# Patient Record
Sex: Female | Born: 1950
Health system: Southern US, Community
[De-identification: ages and names within clinical notes are randomized; demographics above are authoritative.]

## PROBLEM LIST (undated history)

## (undated) DIAGNOSIS — G250 Essential tremor: Principal | ICD-10-CM

## (undated) DIAGNOSIS — E785 Hyperlipidemia, unspecified: Secondary | ICD-10-CM

## (undated) DIAGNOSIS — I209 Angina pectoris, unspecified: Secondary | ICD-10-CM

## (undated) DIAGNOSIS — I2510 Atherosclerotic heart disease of native coronary artery without angina pectoris: Secondary | ICD-10-CM

## (undated) DIAGNOSIS — I10 Essential (primary) hypertension: Secondary | ICD-10-CM

## (undated) DIAGNOSIS — G4730 Sleep apnea, unspecified: Secondary | ICD-10-CM

## (undated) HISTORY — DX: Angina pectoris, unspecified: I20.9

## (undated) HISTORY — DX: Hyperlipidemia, unspecified: E78.5

## (undated) HISTORY — DX: Essential (primary) hypertension: I10

## (undated) HISTORY — DX: Atherosclerotic heart disease of native coronary artery without angina pectoris: I25.10

## (undated) HISTORY — DX: Sleep apnea, unspecified: G47.30

## (undated) HISTORY — DX: Essential tremor: G25.0

---

## 2001-11-13 ENCOUNTER — Observation Stay (HOSPITAL_COMMUNITY): Admission: AD | Admit: 2001-11-13 | Discharge: 2001-11-14 | Payer: Self-pay | Admitting: Obstetrics and Gynecology

## 2001-11-14 ENCOUNTER — Encounter: Payer: Self-pay | Admitting: Obstetrics and Gynecology

## 2001-11-21 ENCOUNTER — Ambulatory Visit (HOSPITAL_COMMUNITY): Admission: RE | Admit: 2001-11-21 | Discharge: 2001-11-21 | Payer: Self-pay | Admitting: Obstetrics and Gynecology

## 2001-11-21 ENCOUNTER — Encounter: Payer: Self-pay | Admitting: Obstetrics and Gynecology

## 2001-11-30 ENCOUNTER — Encounter (INDEPENDENT_AMBULATORY_CARE_PROVIDER_SITE_OTHER): Payer: Self-pay

## 2001-11-30 ENCOUNTER — Ambulatory Visit (HOSPITAL_COMMUNITY): Admission: RE | Admit: 2001-11-30 | Discharge: 2001-11-30 | Payer: Self-pay | Admitting: Obstetrics and Gynecology

## 2002-09-04 ENCOUNTER — Other Ambulatory Visit: Admission: RE | Admit: 2002-09-04 | Discharge: 2002-09-04 | Payer: Self-pay | Admitting: Obstetrics and Gynecology

## 2003-10-07 ENCOUNTER — Ambulatory Visit (HOSPITAL_COMMUNITY): Admission: RE | Admit: 2003-10-07 | Discharge: 2003-10-07 | Payer: Self-pay | Admitting: Obstetrics and Gynecology

## 2003-11-11 ENCOUNTER — Other Ambulatory Visit: Admission: RE | Admit: 2003-11-11 | Discharge: 2003-11-11 | Payer: Self-pay | Admitting: Obstetrics and Gynecology

## 2005-09-28 ENCOUNTER — Emergency Department (HOSPITAL_COMMUNITY): Admission: EM | Admit: 2005-09-28 | Discharge: 2005-09-28 | Payer: Self-pay | Admitting: Emergency Medicine

## 2007-02-27 ENCOUNTER — Ambulatory Visit (HOSPITAL_COMMUNITY): Admission: RE | Admit: 2007-02-27 | Discharge: 2007-02-27 | Payer: Self-pay | Admitting: Internal Medicine

## 2008-03-30 ENCOUNTER — Encounter: Admission: RE | Admit: 2008-03-30 | Discharge: 2008-03-30 | Payer: Self-pay | Admitting: Neurosurgery

## 2008-05-14 ENCOUNTER — Encounter: Admission: RE | Admit: 2008-05-14 | Discharge: 2008-05-14 | Payer: Self-pay | Admitting: Cardiology

## 2008-05-14 DIAGNOSIS — I209 Angina pectoris, unspecified: Secondary | ICD-10-CM

## 2008-05-14 HISTORY — DX: Angina pectoris, unspecified: I20.9

## 2008-05-15 ENCOUNTER — Ambulatory Visit (HOSPITAL_COMMUNITY): Admission: RE | Admit: 2008-05-15 | Discharge: 2008-05-16 | Payer: Self-pay | Admitting: Cardiology

## 2008-05-15 HISTORY — PX: CORONARY ANGIOPLASTY WITH STENT PLACEMENT: SHX49

## 2008-05-15 HISTORY — PX: CARDIAC CATHETERIZATION: SHX172

## 2009-01-30 ENCOUNTER — Emergency Department (HOSPITAL_COMMUNITY): Admission: EM | Admit: 2009-01-30 | Discharge: 2009-01-30 | Payer: Self-pay | Admitting: Emergency Medicine

## 2009-06-18 ENCOUNTER — Encounter: Admission: RE | Admit: 2009-06-18 | Discharge: 2009-06-18 | Payer: Self-pay | Admitting: Neurosurgery

## 2009-07-10 ENCOUNTER — Ambulatory Visit (HOSPITAL_COMMUNITY): Admission: RE | Admit: 2009-07-10 | Discharge: 2009-07-10 | Payer: Self-pay | Admitting: Neurosurgery

## 2009-12-19 ENCOUNTER — Encounter: Admission: RE | Admit: 2009-12-19 | Discharge: 2009-12-19 | Payer: Self-pay | Admitting: Neurosurgery

## 2010-01-01 ENCOUNTER — Ambulatory Visit (HOSPITAL_COMMUNITY): Admission: RE | Admit: 2010-01-01 | Discharge: 2010-01-01 | Payer: Self-pay | Admitting: Neurosurgery

## 2010-03-19 ENCOUNTER — Ambulatory Visit (HOSPITAL_COMMUNITY): Admission: RE | Admit: 2010-03-19 | Discharge: 2010-03-19 | Payer: Self-pay | Admitting: Neurosurgery

## 2010-06-23 DIAGNOSIS — I2510 Atherosclerotic heart disease of native coronary artery without angina pectoris: Secondary | ICD-10-CM

## 2010-06-23 HISTORY — DX: Atherosclerotic heart disease of native coronary artery without angina pectoris: I25.10

## 2010-09-01 ENCOUNTER — Ambulatory Visit (HOSPITAL_COMMUNITY): Admission: RE | Admit: 2010-09-01 | Discharge: 2010-09-01 | Payer: Self-pay | Admitting: Internal Medicine

## 2010-11-08 ENCOUNTER — Encounter: Payer: Self-pay | Admitting: Neurosurgery

## 2011-01-03 LAB — COMPREHENSIVE METABOLIC PANEL
AST: 29 U/L (ref 0–37)
Alkaline Phosphatase: 71 U/L (ref 39–117)
BUN: 19 mg/dL (ref 6–23)
CO2: 30 mEq/L (ref 19–32)
Calcium: 10 mg/dL (ref 8.4–10.5)
Chloride: 106 mEq/L (ref 96–112)
GFR calc Af Amer: >60 mL/min (ref >60)
GFR calc non Af Amer: >60 mL/min (ref >60)
Glucose, Bld: 88 mg/dL (ref 70–99)
Potassium: 4.6 mEq/L (ref 3.5–5.1)
Sodium: 141 mEq/L (ref 135–145)
Total Bilirubin: 0.8 mg/dL (ref 0.3–1.2)
Total Protein: 6.9 g/dL (ref 6.0–8.3)

## 2011-01-03 LAB — CBC: RDW: 12.3 % (ref 11.5–15.5)

## 2011-01-03 LAB — SURGICAL PCR SCREEN: MRSA, PCR: NEGATIVE

## 2011-01-09 LAB — BASIC METABOLIC PANEL
BUN: 14 mg/dL (ref 6–23)
CO2: 31 mEq/L (ref 19–32)
Calcium: 9.6 mg/dL (ref 8.4–10.5)
Creatinine, Ser: 0.81 mg/dL (ref 0.4–1.2)
GFR calc Af Amer: >60 mL/min (ref >60)
Glucose, Bld: 126 mg/dL — ABNORMAL HIGH (ref 70–99)
Potassium: 3.9 mEq/L (ref 3.5–5.1)
Sodium: 143 mEq/L (ref 135–145)

## 2011-01-09 LAB — SURGICAL PCR SCREEN: MRSA, PCR: NEGATIVE

## 2011-01-09 LAB — CBC
MCV: 94 fL (ref 78.0–100.0)
RBC: 4.57 MIL/uL (ref 3.87–5.11)

## 2011-01-21 LAB — BASIC METABOLIC PANEL
BUN: 13 mg/dL (ref 6–23)
Creatinine, Ser: 0.97 mg/dL (ref 0.4–1.2)
GFR calc Af Amer: >60 mL/min (ref >60)
Glucose, Bld: 88 mg/dL (ref 70–99)
Potassium: 4.4 mEq/L (ref 3.5–5.1)

## 2011-01-21 LAB — CBC
Hemoglobin: 14.4 g/dL (ref 12.0–15.0)
Platelets: 227 10*3/uL (ref 150–400)
RDW: 12.3 % (ref 11.5–15.5)
WBC: 6.7 10*3/uL (ref 4.0–10.5)

## 2011-01-21 LAB — PROTIME-INR: Prothrombin Time: 12.8 seconds (ref 11.6–15.2)

## 2011-03-01 NOTE — Cardiovascular Report (Signed)
NAME:  Carolyn Martin, Carolyn Martin              ACCOUNT NO.:  [id number]   MEDICAL RECORD NO.:  [id number]          PATIENT TYPE:  OIB   LOCATION:  2807                         FACILITY:  [facility]   PHYSICIAN:  Madaline Savage, M.D.DATE OF BIRTH:  Apr 07, 1951   DATE OF PROCEDURE:  05/15/3008  DATE OF DISCHARGE:                            CARDIAC CATHETERIZATION   PROCEDURES PERFORMED:  Coronary artery stenting of the proximal left  anterior descending coronary artery and also of the mid left anterior  descending coronary artery.   COMPLICATIONS:  None.   ENTRY SITE:  Right femoral.   DYE USED:  Omnipaque.   MEDICATIONS GIVEN:  1. 600 mg of Plavix by mouth.  2. Angiomax infusion.  3. Pepcid 40 mg intravenously.   COMPLICATIONS:  None.   This percutaneous intervention was performed subsequent to diagnostic  cardiac catheterization performed by Dr. Ritta Slot, my partner, who  performed diagnostic cardiac catheterization on [name].  I was then  asked to step in and dilate 2 areas of severe narrowing that were the  culprit lesions in the LAD that was responsible for her acute coronary  syndrome.   After the patient was confirmed to have an ACT of greater than 200  seconds, I ultimately used an XB left anterior descending coronary  artery catheter.  After several others failed, I used a BMW wire and I  basically direct stented the mid LAD with 2 overlapping MiniVision  stents, and the proximal left anterior descending coronary artery was  dilated with a third MiniVision stent that was 2.5 x 12.  The mid LAD  stents were 2.0 x 15 each and were overlapped.  I then postdilated both  areas with non-compliant balloons and lesions in both locations were  resolved.  Specifically, the proximal LAD stenosis of 90% was reduced to  0% residual with preservation of TIMI-3 flow and the mid-LAD long lesion  of approximately 25-26 mm was reduced from 90% down to 0% residual.  As  previously stated,  there was preservation of TIMI-3 flow distally.  No  complications occurred.  The patient tolerated the procedure well.   FINAL IMPRESSIONS:  Successful proximal left anterior descending  stenting with MiniVision stents 2.0 x 15, post dilated to approximately  2.3 mm and proximal left anterior descending stent of 2.5 x 12, post  dilated to approximately 2.75.           ______________________________  Madaline Savage, M.D.     WHG/MEDQ  D:  05/15/2008  T:  05/16/2008  Job:  [id number]   cc:   Ritta Slot, MD  [facility]

## 2011-03-01 NOTE — Discharge Summary (Signed)
NAME:  Carolyn Martin, Carolyn Martin NO.:  [id number]   MEDICAL RECORD NO.:  [id number]          PATIENT TYPE:  OIB   LOCATION:  2918                         FACILITY:  [facility]   PHYSICIAN:  Antonieta Iba, MD   DATE OF BIRTH:  09-20-1951   DATE OF ADMISSION:  05/15/2008  DATE OF DISCHARGE:  05/16/2008                               DISCHARGE SUMMARY   [name] is a 60 year old white married female patient of Dr.  Allyne Gee', who came to the office for a stress test.  She developed  abnormal EKG and the scan of her heart revealed abnormal images.  She  was then referred to see Dr. Elsie Lincoln in the office on the same day.  She  apparently has been having tightness around the base of her neck, which  she felt was related to her cervical spine disease.  She also noted that  on exertion that the symptoms would come.  She was recently also  diagnosed with hyperlipidemia, but she did not want to take the  medications.  She also has obstructive sleep apnea and has been  prescribed a CPAP, which she does not use regularly.  It was decided  that she should undergo cardiac catheterization, thus she came to the  hospital as an outpatient for cath.  She was initially cath'ed by Dr.  Lynnea Ferrier.  She had a 99% proximal LAD, 85% mid LAD, and a 30% RCA.  Her  EF of 65%.  She went on to have 3 stents placed in her LAD by Dr.  Elsie Lincoln.  It was a difficult case.  Please see his dictated report for  complete details.  She had a 2.0 x 15, a 2.5 x 12, and a 2.0 x 15 Multi-  Link Mini-Vision stent placed.  On the morning of May 16, 2008, she was  seen by Dr. Mariah Milling.  Her blood pressure was 95/50, her heart rate was  63, respirations 14, O2 sats were 96% on room air, and temperature was  98.2.   Her labs showed a sodium of 138, potassium 4.3, BUN 12, creatinine 0.88,  and glucose was 122.  CK was 125 and CK-MB 1.7.  Troponin was 0.04.  Hemoglobin 13.7, hematocrit 40.2, WBCs 11.1, and platelets 263.   She was  considered stable for discharge home.  She will be seen by Dr. Mariah Milling in  the office next Friday.  She was told not to return to work until May 26, 2008.  She was given a note about this.   DISCHARGE MEDICATIONS:  1. Plavix 75 mg a day, she should not stop it.  2. Aspirin 325 mg a day.  3. Simvastatin 40 mg at bedtime.  4. Vitamin D3, she takes 2 pills a week she was on previously.   DISCHARGE DIAGNOSES:  1. Angina and abnormal nuclear Myoview  2. Coronary artery disease, status post cath with high-grade disease      in her left anterior descending, minimal disease in her right      coronary artery with subsequent stenting with 3 Multi-Link Mini-  Vision stents, which are nondrug-eluting stents to her left      anterior descending.  3. Normal ejection fraction.  4. Hyperlipidemia, placed on 40 mg of simvastatin.  5. Obstructive sleep apnea, wears CPAP.      Lezlie Octave, N.P.      Antonieta Iba, MD  Electronically Signed    BB/MEDQ  D:  05/16/2008  T:  05/17/2008  Job:  [id number]   cc:   Candyce Churn. Allyne Gee, M.D.

## 2011-03-04 NOTE — Op Note (Signed)
[facility]  Patient:    Carolyn Martin, Carolyn Martin Visit Number: [id number] MRN: [id number]          Service Type: DSU Location: [facility] Attending Physician:  Jaymes Graff A Dictated by:   Pierre Bali Normand Sloop, M.D. Proc. Date: 11/30/01 Admit Date:  11/30/2001                             Operative Report  DATE OF BIRTH:                [birth year].  PREOPERATIVE DIAGNOSIS:       Menometrorrhagia.  POSTOPERATIVE DIAGNOSIS:      Menometrorrhagia.  OPERATION:                    Dilatation and curettage and                               hysteroscopy.  SURGEON:                      Naima A. Normand Sloop, M.D.  ANESTHESIA:                   MAC.  15 cc 1% lidocaine used for cervical block.  ESTIMATED BLOOD LOSS:         Minimal.  IV FLUIDS:                    800 cc crystalloid.  There was a minus 40 cc deficit of 3% sorbitol solution used for hysteroscopic fluid.  FINDINGS:                     Abundant fluffy endometrium.  No polyps or submucosal fibroids seen.  Both ostia were visualized.  The patient went to the recovery room in stable condition.  COMPLICATIONS:                None.  DESCRIPTION OF PROCEDURE:     The patient was taken to the operating room, given MAC anesthesia, placed in the dorsal lithotomy position and prepped and draped in the normal sterile fashion.  A bivalve speculum was placed into the vagina and the anterior lip of the cervix grasped with a single-tooth tenaculum.  The cervix was then dilated with Shawnie Pons dilators up to 21.  The hysteroscope was then placed into the uterus and the findings noted above were seen.  The hysteroscope was then removed and a sharp curettage was placed into the uterus and curettings were obtained.  There was abundant amount of curettings obtained.  All were sent to pathology.  All instruments were removed from the vagina.  Tenaculum was removed from the cervix with good hemostasis noted.  Sponge, lap and  needle counts were correct x 2.  The patient went to the recovery room in stable condition.  Before the procedure, the patient understands that this is done for therapeutic reasons.  She did have endometrial biopsy stating that she had secretory endometrium.  The patient also understands the risks of the procedure were, but not limited to, bleeding, infection, perforation of the uterus and damage to bowel, bladder and internal organs. Dictated by:   Pierre Bali. Normand Sloop, M.D. Attending Physician:  Michael Litter DD:  11/30/01 TD:  11/30/01 Job: [id number]

## 2011-03-04 NOTE — H&P (Signed)
[facility]  Patient:    Carolyn Martin, Carolyn Martin Visit Number: [id number] MRN: [id number]          Service Type: Attending:  Naima A. Normand Sloop, M.D. Dictated by:   Pierre Bali. Normand Sloop, M.D.                           History and Physical  HISTORY:                      Patient is a 60 year old white female G2, P2 who was seen in the ER last week and diagnosed with menometrorrhagia and fibroids. Patient was given Lupron in the hospital and iron but still reports that she is having some bleeding.  She does not have any shortness of breath, chest pain, or dizziness, but does report generalized weakness.  Patient was offered a blood transfusion while she was in the hospital, but declined and still declines.  PAST MEDICAL HISTORY:         Significant for degenerative joint disease.  PAST SURGICAL HISTORY:        Significant for appendectomy and ureteral transection and reanastomosis of the right side.  PAST GYNECOLOGIC HISTORY:     In the last couple of months her bleeding has become heavier where she changes a pad one every two hours.  States that she has irregular bleeding throughout the month.  ALLERGIES:                    No known drug allergies.  SOCIAL HISTORY:               She quit cigarette smoking in December 2002.                                Ultrasound showed endometrial stripe at 1.9 cm and a left lateral fundal fibroid 2.9 x 2.4 x 2.5 cm.  No adnexa was seen. Cannot exclude an endometrial polyp.  PHYSICAL EXAMINATION  VITAL SIGNS:                  Blood pressure 130/80, weight 165 pounds.  GENERAL:                      Patient is in no apparent distress.  HEART:                        Regular.  LUNGS:                        Clear.  ABDOMEN:                      Soft and nontender.  BREASTS:                      No masses, discharge, skin changes, or nipple retraction.  No axillary lymphadenopathy.  BACK:                         No CVA  tenderness.  ABDOMEN:                      Nontender.  No masses or organomegaly.  EXTREMITIES:  No clubbing, cyanosis, edema bilaterally.  PELVIC:                       Vulva/vaginal examination was within normal limits.  Cervix was nontender without any lesions.  Uterus is normal size, shape, and consistency with mild tenderness.  Adnexa had no masses.  LABORATORIES:                 Pregnancy test was found to be negative. Hemoglobin was stable at 6.9 from the hospital.  IMPRESSION:                   Patient with fibroids, menometrorrhagia, and anemia.  Endometrial biopsy was done and showed benign secretory endometrium without any hyperplasia or malignancy.  Patient received Lupron in the hospital and is currently taking Provera 10 mg q.d.  Patient still with some vaginal bleeding and anemia.  Although the hemoglobin is stable, because cannot rule out a polyp in endometrial canal, patient was scheduled for dilatation and curettage, hysteroscopy as soon as possible.  Patient was given bleeding precautions and told to call if she has vaginal bleeding that causes her to soak more than one pad an hour, has chest pain, shortness of breath, or dizziness.  Patient was also given iron prescription.  She was also offered a blood transfusion, but refused.  Risks and benefits were reviewed in detail. Patient does not want a hysterectomy and was given Lupron.  Does not want any other therapies at this given time except for the dilatation and curettage, hysteroscopy.  Patient understands that the risks are, but not limited to, bleeding, infection, perforation of the uterus, damage to internal organs such as bowel and bladder. Dictated by:   Pierre Bali. Normand Sloop, M.D. Attending:  Naima A. Dillard, M.D. DD:  11/29/01 TD:  11/29/01 Job: [id number]

## 2011-05-24 IMAGING — MG MM DIGITAL SCREENING
6 series · 6 of 6 positions shown · non-contrast
Comparison: none

DG SCREEN MAMMOGRAM BILATERAL
Bilateral CC and MLO view(s) were taken.

DIGITAL SCREENING MAMMOGRAM WITH CAD:
The breast tissue is heterogeneously dense.  No masses or malignant type calcifications are 
identified.  Compared with prior studies.
Images were processed with CAD.

[R CC]
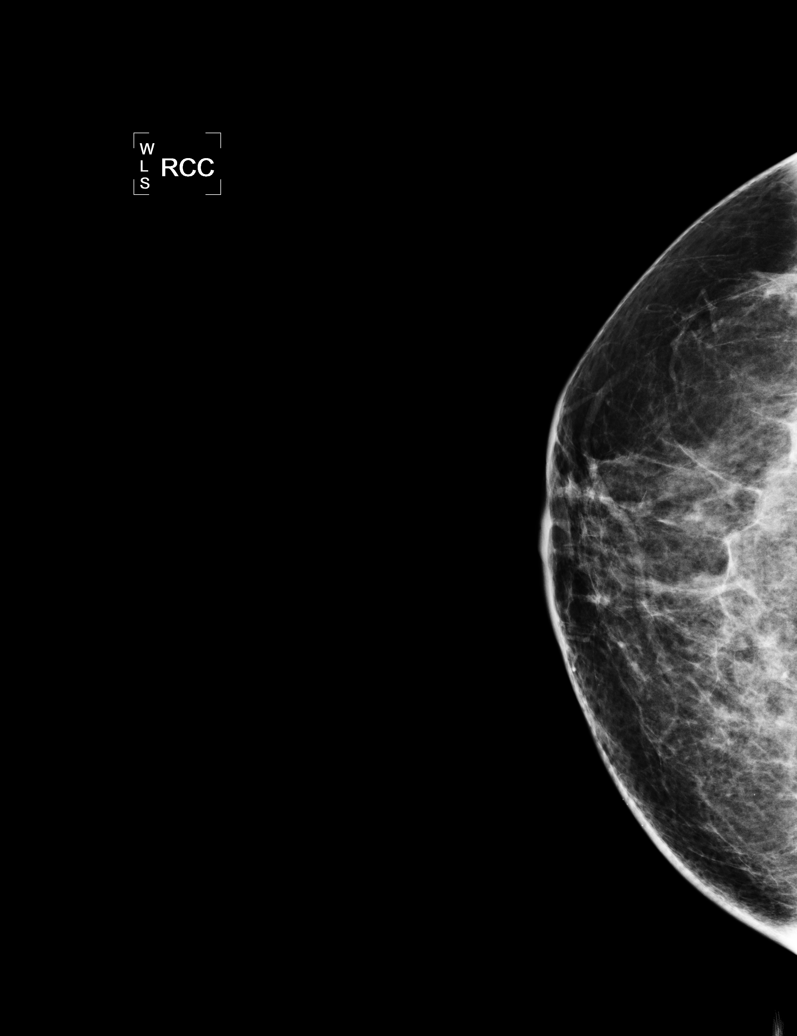

[R MLO (1 of 2)]
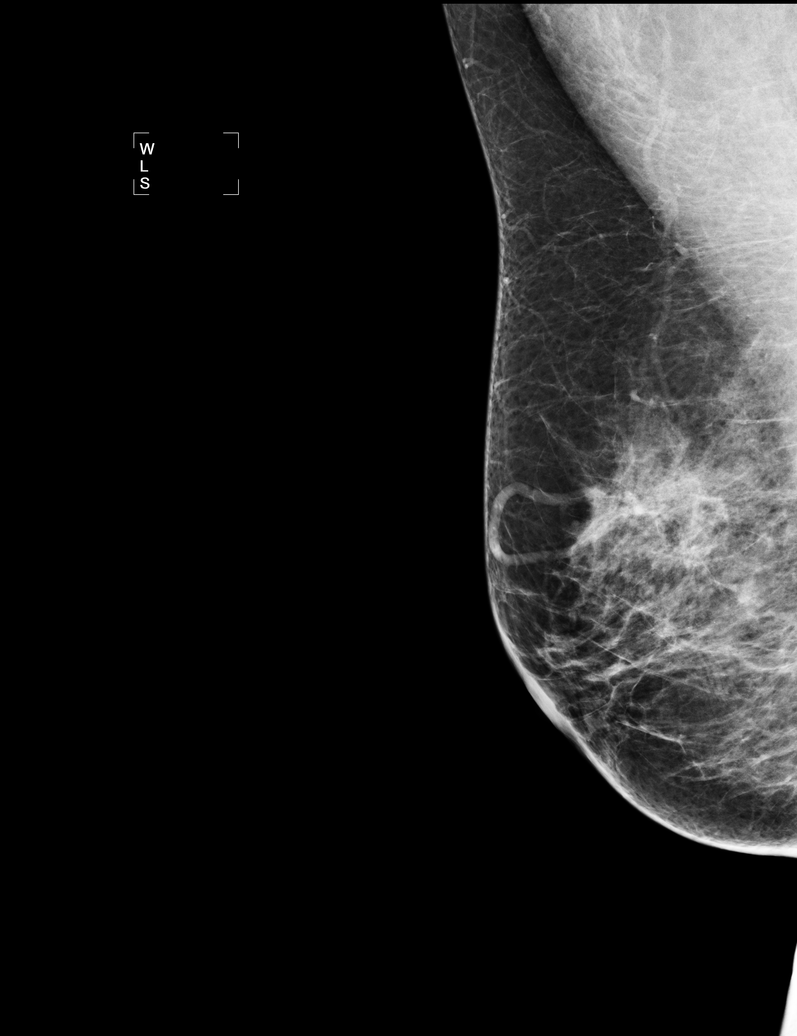

[L CC]
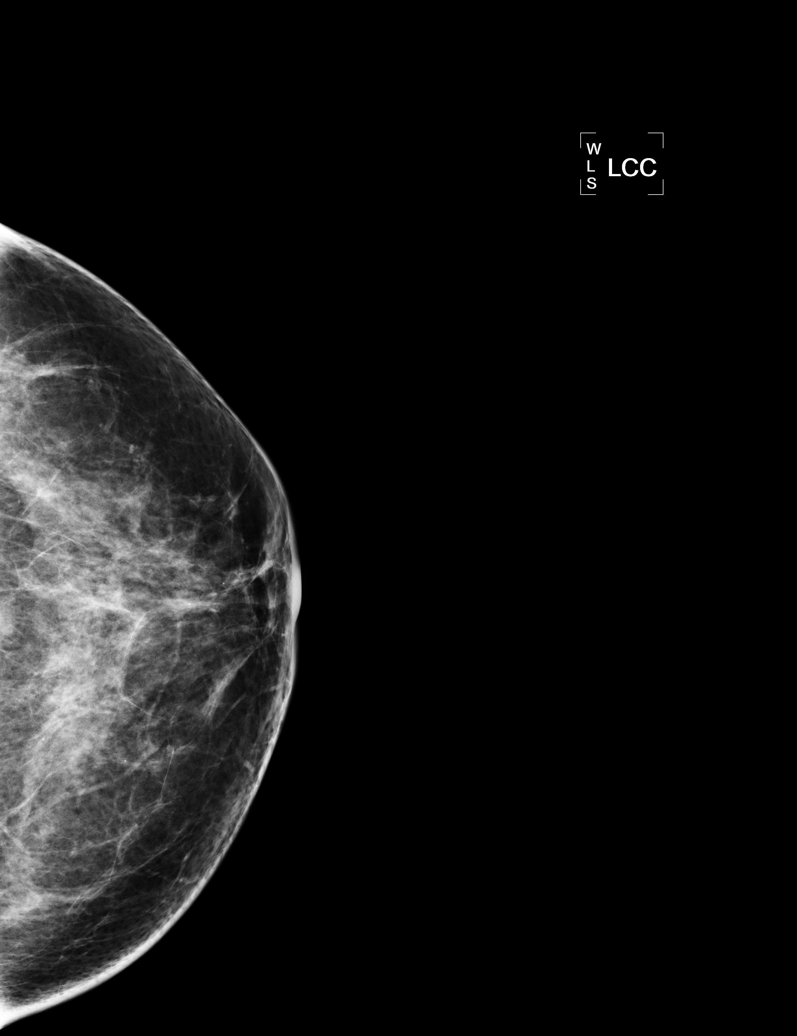

[L MLO (1 of 2)]
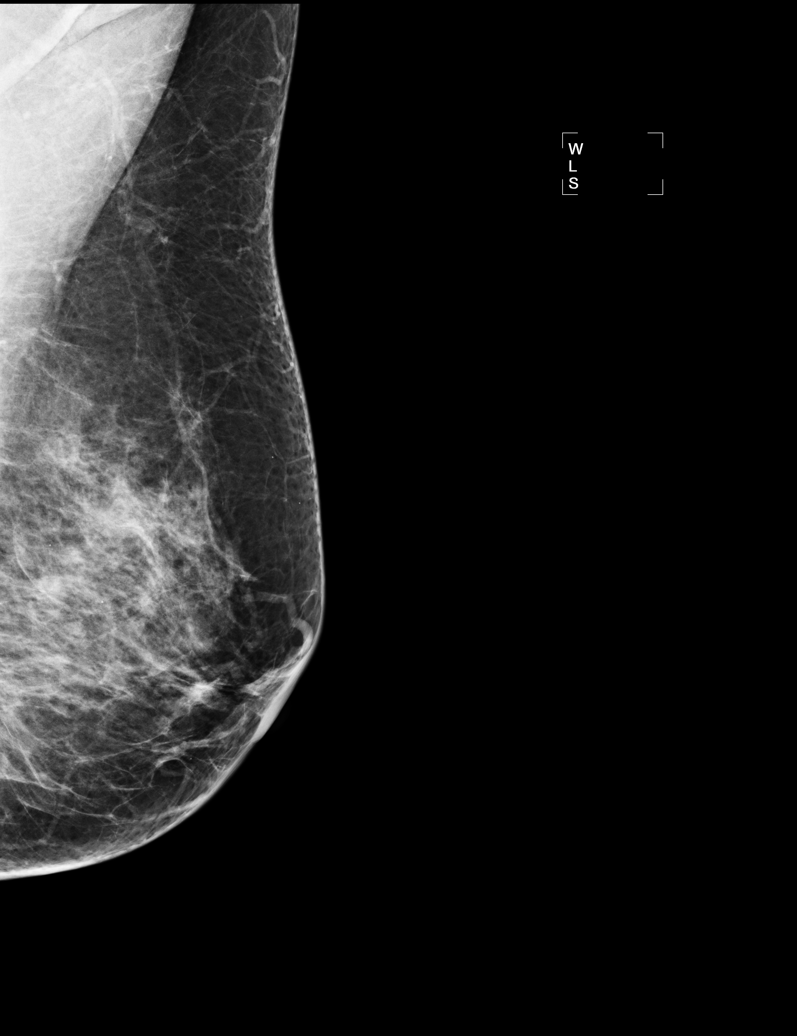

[L MLO (2 of 2)]
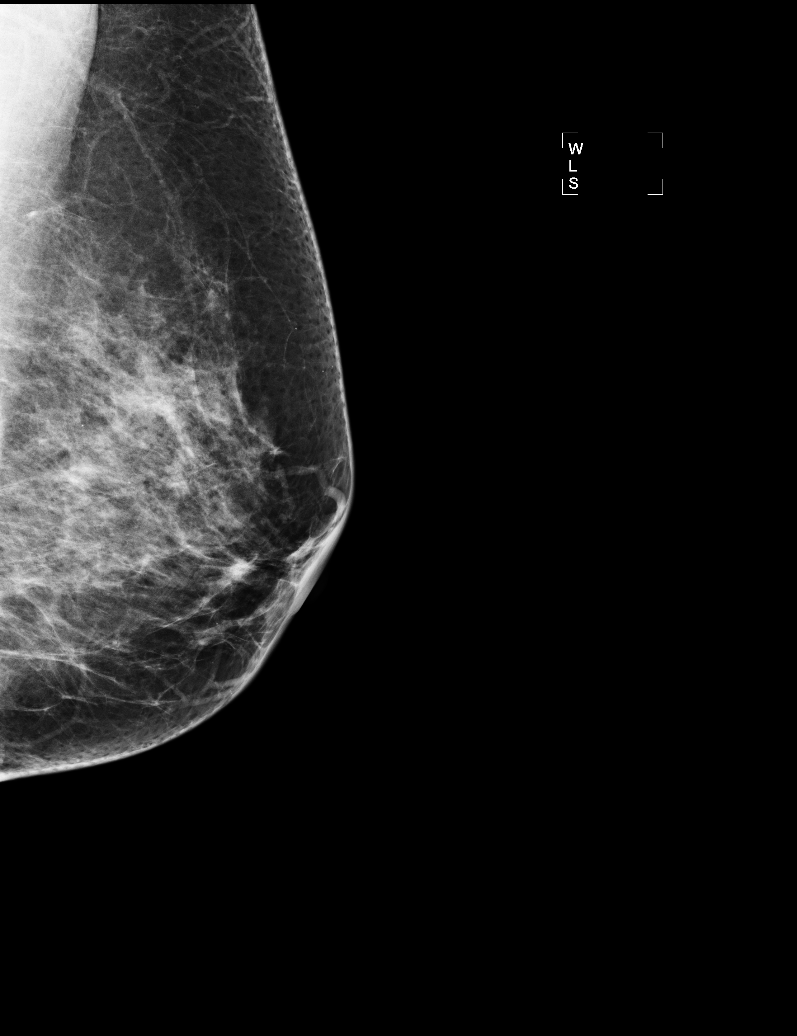

[R MLO (2 of 2)]
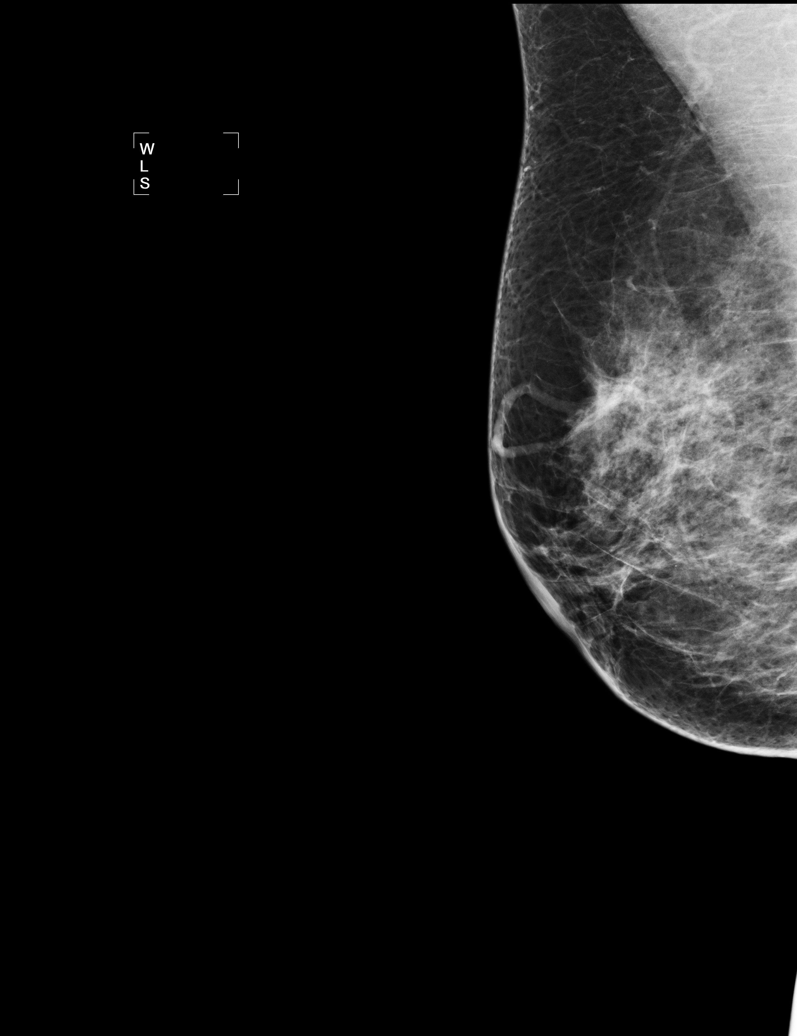

[6 of 6 positions shown; findings below may reference images not displayed]

IMPRESSION: No specific mammographic evidence of malignancy.  Next screening mammogram is recommended in one 
year.

A result letter of this screening mammogram will be mailed directly to the patient.

ASSESSMENT: Negative - BI-RADS 1

Screening mammogram in 1 year.
,

## 2011-07-15 LAB — CBC
HCT: 40.2
Hemoglobin: 13.7
MCHC: 34
MCV: 94.6
RBC: 4.24
WBC: 11.1 — ABNORMAL HIGH

## 2011-07-15 LAB — BASIC METABOLIC PANEL
Calcium: 9.1
Chloride: 106
Creatinine, Ser: 0.88

## 2011-07-29 ENCOUNTER — Other Ambulatory Visit (HOSPITAL_COMMUNITY): Payer: Self-pay | Admitting: Internal Medicine

## 2011-07-29 DIAGNOSIS — Z1231 Encounter for screening mammogram for malignant neoplasm of breast: Secondary | ICD-10-CM

## 2011-09-05 ENCOUNTER — Ambulatory Visit (HOSPITAL_COMMUNITY): Payer: Self-pay

## 2012-08-12 ENCOUNTER — Emergency Department: Payer: Self-pay | Admitting: Internal Medicine

## 2012-10-04 ENCOUNTER — Other Ambulatory Visit (HOSPITAL_COMMUNITY): Payer: Self-pay | Admitting: Internal Medicine

## 2012-10-04 DIAGNOSIS — Z1231 Encounter for screening mammogram for malignant neoplasm of breast: Secondary | ICD-10-CM

## 2012-10-23 ENCOUNTER — Ambulatory Visit (HOSPITAL_COMMUNITY): Payer: Federal, State, Local not specified - PPO

## 2012-11-01 ENCOUNTER — Ambulatory Visit (HOSPITAL_COMMUNITY)
Admission: RE | Admit: 2012-11-01 | Discharge: 2012-11-01 | Disposition: A | Discharge status: Home / Self Care | Payer: Federal, State, Local not specified - PPO | Source: Ambulatory Visit | Attending: Internal Medicine | Admitting: Internal Medicine

## 2012-11-01 DIAGNOSIS — Z1231 Encounter for screening mammogram for malignant neoplasm of breast: Secondary | ICD-10-CM | POA: Insufficient documentation

## 2012-12-16 ENCOUNTER — Encounter: Payer: Self-pay | Admitting: *Deleted

## 2013-01-15 ENCOUNTER — Encounter: Payer: Self-pay | Admitting: Cardiovascular Disease

## 2013-08-28 ENCOUNTER — Other Ambulatory Visit (HOSPITAL_COMMUNITY): Payer: Self-pay | Admitting: Internal Medicine

## 2013-08-28 DIAGNOSIS — Z1231 Encounter for screening mammogram for malignant neoplasm of breast: Secondary | ICD-10-CM

## 2013-12-24 ENCOUNTER — Other Ambulatory Visit (HOSPITAL_COMMUNITY): Payer: Self-pay | Admitting: Internal Medicine

## 2013-12-24 DIAGNOSIS — Z1231 Encounter for screening mammogram for malignant neoplasm of breast: Secondary | ICD-10-CM

## 2013-12-25 ENCOUNTER — Ambulatory Visit (HOSPITAL_COMMUNITY)
Admission: RE | Admit: 2013-12-25 | Discharge: 2013-12-25 | Disposition: A | Discharge status: Home / Self Care | Payer: Federal, State, Local not specified - PPO | Source: Ambulatory Visit | Attending: Internal Medicine | Admitting: Internal Medicine

## 2013-12-25 DIAGNOSIS — Z1231 Encounter for screening mammogram for malignant neoplasm of breast: Secondary | ICD-10-CM | POA: Insufficient documentation

## 2015-02-11 ENCOUNTER — Other Ambulatory Visit (HOSPITAL_COMMUNITY): Payer: Self-pay | Admitting: Internal Medicine

## 2015-02-11 DIAGNOSIS — Z1231 Encounter for screening mammogram for malignant neoplasm of breast: Secondary | ICD-10-CM

## 2015-02-13 ENCOUNTER — Ambulatory Visit (HOSPITAL_COMMUNITY)
Admission: RE | Admit: 2015-02-13 | Discharge: 2015-02-13 | Disposition: A | Discharge status: Home / Self Care | Payer: Federal, State, Local not specified - PPO | Source: Ambulatory Visit | Attending: Internal Medicine | Admitting: Internal Medicine

## 2015-02-13 DIAGNOSIS — Z1231 Encounter for screening mammogram for malignant neoplasm of breast: Secondary | ICD-10-CM | POA: Insufficient documentation

## 2016-01-13 ENCOUNTER — Other Ambulatory Visit: Payer: Self-pay

## 2016-01-13 DIAGNOSIS — Z1231 Encounter for screening mammogram for malignant neoplasm of breast: Secondary | ICD-10-CM

## 2016-01-20 ENCOUNTER — Telehealth: Payer: Self-pay | Admitting: Cardiovascular Disease

## 2016-01-20 NOTE — Telephone Encounter (Signed)
Received records from [facility] for appointment on 01/21/16 with Dr Gwenlyn Found.  Records given to [facility] (medical records) for Dr Kennon Holter schedule on 01/21/16. lp

## 2016-01-21 ENCOUNTER — Ambulatory Visit (INDEPENDENT_AMBULATORY_CARE_PROVIDER_SITE_OTHER): Payer: Federal, State, Local not specified - PPO | Admitting: Cardiovascular Disease

## 2016-01-21 ENCOUNTER — Encounter: Payer: Self-pay | Admitting: Cardiovascular Disease

## 2016-01-21 VITALS — BP 146/90 | HR 67 | Ht 63.0 in | Wt 145.0 lb

## 2016-01-21 DIAGNOSIS — I10 Essential (primary) hypertension: Secondary | ICD-10-CM

## 2016-01-21 DIAGNOSIS — I2583 Coronary atherosclerosis due to lipid rich plaque: Secondary | ICD-10-CM

## 2016-01-21 DIAGNOSIS — R079 Chest pain, unspecified: Secondary | ICD-10-CM

## 2016-01-21 DIAGNOSIS — I2510 Atherosclerotic heart disease of native coronary artery without angina pectoris: Secondary | ICD-10-CM

## 2016-01-21 DIAGNOSIS — E785 Hyperlipidemia, unspecified: Secondary | ICD-10-CM | POA: Diagnosis not present

## 2016-01-21 NOTE — Assessment & Plan Note (Addendum)
History of hyperlipidemia on Crestor with recent lipid profile performed by her PCP revealing a total cholesterol 156, LDL of 69 and HDL of 54.

## 2016-01-21 NOTE — Assessment & Plan Note (Signed)
History of CAD status post cardiac catheterization performed by Dr. Elisabeth Cara 05/15/08 revealing high-grade proximal and mid LAD disease with stenting performed with Dr. Melvern Banker using multiple overlapping mini vision bare metal stents. The patient does complain of occasional chest pain. I'm going to get an exercise Myoview stress test to risk stratify her.

## 2016-01-21 NOTE — Patient Instructions (Signed)
Medication Instructions:  Your physician recommends that you continue on your current medications as directed. Please refer to the Current Medication list given to you today.   Labwork: none  Testing/Procedures: Your physician has requested that you have en exercise stress myoview. For further information please visit [email or web address]. Please follow instruction sheet, as given.    Follow-Up: Your physician wants you to follow-up in: 12 months with Dr. Berry. You will receive a reminder letter in the mail two months in advance. If you don't receive a letter, please call our office to schedule the follow-up appointment.   Any Other Special Instructions Will Be Listed Below (If Applicable).     If you need a refill on your cardiac medications before your next appointment, please call your pharmacy.   

## 2016-01-21 NOTE — Assessment & Plan Note (Signed)
History of hypertension blood pressure measured at 146/90. This is higher than it usually runs. She is not on any antihypertensive medications.

## 2016-01-21 NOTE — Progress Notes (Signed)
01/21/2016 Carolyn Martin   12/21/1950  IY:[id number]  Primary Physician [name], MD Primary Cardiologist: Lorretta Harp MD Renae Gloss   HPI: Mrs. Kalicki is a 65 year old thin-appearing married Caucasian female mother of 2, grandmother 2 grandchildren who we saw in our practice remotely. She is referred back to be reestablished because of occasional chest pain. She is retired from working at the [employer] for 32 years. Risk factors include treated hypertension and hyperlipidemia. Her mother did have coronary artery bypass grafting in her 90s. She had LAD stenting, Dr. Melvern Banker using 3 sequential bare-metal stents 05/15/08. She does get occasional chest pain.   Current Outpatient Prescriptions  Medication Sig Dispense Refill  . Ascorbic Acid (VITAMIN C PO) Take 500 mg by mouth daily.     Marland Kitchen aspirin 81 MG tablet Take 81 mg by mouth daily.    . B Complex Vitamins (B COMPLEX 100 PO) Take 100 mg by mouth daily.    . Cholecalciferol (VITAMIN D-3 PO) Take 5,000 Units by mouth daily.     . citalopram (CELEXA) 10 MG tablet Take 10 mg by mouth daily.    . clonazePAM (KLONOPIN) 0.5 MG tablet Take 0.5 mg by mouth 2 (two) times daily as needed for anxiety.    . Coenzyme Q10 (CO Q 10 PO) Take 100 mg by mouth daily.     Marland Kitchen KRILL OIL PO Take 1 tablet by mouth daily.    . rosuvastatin (CRESTOR) 10 MG tablet Take 10 mg by mouth daily.     No current facility-administered medications for this visit.    Allergies  Allergen Reactions  . Sulfa Antibiotics     Social History   Social History  . Marital Status: Married    Spouse Name: Hendricks Milo  . Number of Children: 2  . Years of Education: N/A   Occupational History  .  [employer]   Social History Main Topics  . Smoking status: Former Smoker    Types: Cigarettes    Quit date: 12/17/2006  . Smokeless tobacco: Never Used  . Alcohol Use: Not on file  . Drug Use: Not on file  . Sexual Activity: Not on file   Other  Topics Concern  . Not on file   Social History Narrative     Review of Systems: General: negative for chills, fever, night sweats or weight changes.  Cardiovascular: negative for chest pain, dyspnea on exertion, edema, orthopnea, palpitations, paroxysmal nocturnal dyspnea or shortness of breath Dermatological: negative for rash Respiratory: negative for cough or wheezing Urologic: negative for hematuria Abdominal: negative for nausea, vomiting, diarrhea, bright red blood per rectum, melena, or hematemesis Neurologic: negative for visual changes, syncope, or dizziness All other systems reviewed and are otherwise negative except as noted above.    Blood pressure 146/90, pulse 67, height 5\' 3"  (1.6 m), weight 145 lb (65.772 kg).  General appearance: alert and no distress Neck: no adenopathy, no carotid bruit, no JVD, supple, symmetrical, trachea midline and thyroid not enlarged, symmetric, no tenderness/mass/nodules Lungs: clear to auscultation bilaterally Heart: regular rate and rhythm, S1, S2 normal, no murmur, click, rub or gallop Extremities: extremities normal, atraumatic, no cyanosis or edema  EKG normal sinus rhythm at 67 without ST or T-wave changes. I personally reviewed this EKG  ASSESSMENT AND PLAN:   Essential hypertension History of hypertension blood pressure measured at 146/90. This is higher than it usually runs. She is not on any antihypertensive medications.  Hyperlipidemia History of  hyperlipidemia on Crestor with recent lipid profile performed by her PCP revealing a total cholesterol 156, LDL of 69 and HDL of 54.  Coronary artery disease due to lipid rich plaque History of CAD status post cardiac catheterization performed by Dr. Elisabeth Cara 05/15/08 revealing high-grade proximal and mid LAD disease with stenting performed with Dr. Melvern Banker using multiple overlapping mini vision bare metal stents. The patient does complain of occasional chest pain. I'm going to get an  exercise Myoview stress test to risk stratify her.      Lorretta Harp MD FACP,FACC,FAHA, [facility] 01/21/2016 4:40 PM

## 2016-01-27 ENCOUNTER — Telehealth: Payer: Self-pay | Admitting: Cardiovascular Disease

## 2016-01-27 NOTE — Telephone Encounter (Signed)
No need for prophylaxis.  Leonie Man, MD

## 2016-01-27 NOTE — Telephone Encounter (Signed)
Pt have stents,needs to know if pt needs to be pre- medicated before dental work?

## 2016-01-27 NOTE — Telephone Encounter (Signed)
Informed Kristi per guidelines, does not look like pt would need prophylaxis for dental procedures. Will route to Dr. Ellyn Hack ([facility]) to verify.  If OK, will fax a note to dentist office at [phone]

## 2016-01-27 NOTE — Telephone Encounter (Signed)
Faxed msg.

## 2016-01-29 ENCOUNTER — Ambulatory Visit: Payer: Federal, State, Local not specified - PPO | Admitting: Neurology

## 2016-02-03 ENCOUNTER — Telehealth (HOSPITAL_COMMUNITY): Payer: Self-pay

## 2016-02-03 NOTE — Telephone Encounter (Signed)
Encounter complete. 

## 2016-02-05 ENCOUNTER — Ambulatory Visit (HOSPITAL_COMMUNITY)
Admission: RE | Admit: 2016-02-05 | Discharge: 2016-02-05 | Disposition: A | Discharge status: Home / Self Care | Payer: Federal, State, Local not specified - PPO | Source: Ambulatory Visit | Attending: Cardiovascular Disease | Admitting: Cardiovascular Disease

## 2016-02-05 DIAGNOSIS — I2510 Atherosclerotic heart disease of native coronary artery without angina pectoris: Secondary | ICD-10-CM | POA: Diagnosis not present

## 2016-02-05 DIAGNOSIS — Z87891 Personal history of nicotine dependence: Secondary | ICD-10-CM | POA: Diagnosis not present

## 2016-02-05 DIAGNOSIS — R002 Palpitations: Secondary | ICD-10-CM | POA: Diagnosis not present

## 2016-02-05 DIAGNOSIS — R079 Chest pain, unspecified: Secondary | ICD-10-CM | POA: Diagnosis not present

## 2016-02-05 DIAGNOSIS — I2583 Coronary atherosclerosis due to lipid rich plaque: Secondary | ICD-10-CM

## 2016-02-05 DIAGNOSIS — Z8249 Family history of ischemic heart disease and other diseases of the circulatory system: Secondary | ICD-10-CM | POA: Diagnosis not present

## 2016-02-05 DIAGNOSIS — I10 Essential (primary) hypertension: Secondary | ICD-10-CM | POA: Insufficient documentation

## 2016-02-05 DIAGNOSIS — E785 Hyperlipidemia, unspecified: Secondary | ICD-10-CM | POA: Diagnosis not present

## 2016-02-05 LAB — MYOCARDIAL PERFUSION IMAGING
CHL CUP RESTING HR STRESS: 76 {beats}/min
CHL RATE OF PERCEIVED EXERTION: 15
CSEPHR: 88 %
Estimated workload: 10.1 METS
Exercise duration (min): 9 min
MPHR: 157 {beats}/min
Peak HR: 139 {beats}/min

## 2016-02-05 MED ORDER — TECHNETIUM TC 99M SESTAMIBI GENERIC - CARDIOLITE
30.1000 | Freq: Once | INTRAVENOUS | Status: AC | PRN
  Administered 2016-02-05: 30.1 via INTRAVENOUS

## 2016-02-05 MED ORDER — TECHNETIUM TC 99M SESTAMIBI GENERIC - CARDIOLITE
10.1000 | Freq: Once | INTRAVENOUS | Status: AC | PRN
  Administered 2016-02-05: 10.1 via INTRAVENOUS

## 2016-02-19 ENCOUNTER — Ambulatory Visit
Admission: RE | Admit: 2016-02-19 | Discharge: 2016-02-19 | Disposition: A | Discharge status: Home / Self Care | Payer: Federal, State, Local not specified - PPO | Source: Ambulatory Visit

## 2016-02-19 ENCOUNTER — Encounter: Payer: Self-pay | Admitting: Neurology

## 2016-02-19 ENCOUNTER — Ambulatory Visit (INDEPENDENT_AMBULATORY_CARE_PROVIDER_SITE_OTHER): Payer: Federal, State, Local not specified - PPO | Admitting: Neurology

## 2016-02-19 VITALS — BP 140/82 | HR 72 | Ht 63.0 in | Wt 143.5 lb

## 2016-02-19 DIAGNOSIS — G250 Essential tremor: Secondary | ICD-10-CM | POA: Diagnosis not present

## 2016-02-19 DIAGNOSIS — Z1231 Encounter for screening mammogram for malignant neoplasm of breast: Secondary | ICD-10-CM

## 2016-02-19 HISTORY — DX: Essential tremor: G25.0

## 2016-02-19 NOTE — Patient Instructions (Signed)
Tremor °A tremor is trembling or shaking that you cannot control. Most tremors affect the hands or arms. Tremors can also affect the head, vocal cords, face, and other parts of the body. There are many types of tremors. Common types include:  °· Essential tremor. These usually occur in people over the age of 40. It may run in families and can happen in otherwise healthy people.   °· Resting tremor. These occur when the muscles are at rest, such as when your hands are resting in your lap. People with Parkinson disease often have resting tremors.   °· Postural tremor. These occur when you try to hold a pose, such as keeping your hands outstretched.   °· Kinetic tremor. These occur during purposeful movement, such as trying to touch a finger to your nose.   °· Task-specific tremor. These may occur when you perform tasks such as handwriting, speaking, or standing.   °· Psychogenic tremor. These dramatically lessen or disappear when you are distracted. They can happen in people of all ages.   °Some types of tremors have no known cause. Tremors can also be a symptom of nervous system problems (neurological disorders) that may occur with aging. Some tremors go away with treatment while others do not.  °HOME CARE INSTRUCTIONS °Watch your tremor for any changes. The following actions may help to lessen any discomfort you are feeling: °· Take medicines only as directed by your health care provider.   °· Limit alcohol intake to no more than 1 drink per day for nonpregnant women and 2 drinks per day for men. One drink equals 12 oz of beer, 5 oz of wine, or 1½ oz of hard liquor. °· Do not use any tobacco products, including cigarettes, chewing tobacco, or electronic cigarettes. If you need help quitting, ask your health care provider.   °· Avoid extreme heat or cold.    °· Limit the amount of caffeine you consume as directed by your health care provider.   °· Try to get 8 hours of sleep each night. °· Find ways to manage your  stress, such as meditation or yoga. °· Keep all follow-up visits as directed by your health care provider. This is important. °SEEK MEDICAL CARE IF: °· You start having a tremor after starting a new medicine. °· You have tremor with other symptoms such as: °¨ Numbness. °¨ Tingling. °¨ Pain. °¨ Weakness. °· Your tremor gets worse. °· Your tremor interferes with your day-to-day life. °  °This information is not intended to replace advice given to you by your health care provider. Make sure you discuss any questions you have with your health care provider. °  °Document Released: 09/23/2002 Document Revised: 10/24/2014 Document Reviewed: 03/31/2014 °Elsevier Interactive Patient Education ©2016 [employer] ° °

## 2016-02-19 NOTE — Progress Notes (Signed)
Reason for visit: Tremor  Referring physician: Dr. Lillie Fragmin Carolyn Martin is a 65 y.o. female  History of present illness:  Carolyn Martin is a 65 year old right-handed white female with a history of a tremor that has affected the head and neck over the last 7-8 years. As time has gone on, patient has noted that the tremor has become a bit more prominent. The tremor is a side-to-side head tremor unassociated with involvement of the arms, jaw, or voice. The patient indicates that there is no family history of tremor. She indicates that the tremor of the head is present with primary gaze, not with turning the head from one side to the next. She denies any weakness or numbness of the face, arms, or legs. She denies any balance issues or difficulty controlling the bowels or the bladder. The patient does have some neck stiffness and some shoulder discomfort. She reports that she is under a lot of stress. She is sent to this office for an evaluation. Recent blood work has included a normal TSH.  Past Medical History  Diagnosis Date  . Hypertension   . Hyperlipidemia   . Coronary artery disease 06/23/2010    Echo EF =>55%,LV normal  . Anginal pain ([place]) 05/14/2008    atypical chest pain-- Exercise  Myoview  EF 61% ,mod to severe ischemia Basal Anterior ,Anteroseptal,Mid Anterior,Mid Anteroseptal, Apical Anterior and Apical Septal regions   LV normal   . Sleep apnea   . Tremor, essential 02/19/2016    Past Surgical History  Procedure Laterality Date  . Cardiac catheterization  05/15/2008    2 areas of LAD  . Coronary angioplasty with stent placement  05/15/2008     proximal LAD -MiniVision stent 2.5 x 12 and mid LAD 2 overlapping MiniVision 2.0 x 15    Family History  Problem Relation Age of Onset  . Diabetes Mother   . Hypertension Mother   . Transient ischemic attack Father   . Coronary artery disease Father   . Dementia Father     Social history:  reports that she quit smoking  about 9 years ago. Her smoking use included Cigarettes. She has never used smokeless tobacco. She reports that she does not drink alcohol or use illicit drugs.  Medications:  Prior to Admission medications   Medication Sig Start Date End Date Taking? Authorizing Provider  Ascorbic Acid (VITAMIN C PO) Take 500 mg by mouth daily.    Yes Historical Provider, MD  aspirin 81 MG tablet Take 81 mg by mouth daily.   Yes Historical Provider, MD  B Complex Vitamins (B COMPLEX 100 PO) Take 100 mg by mouth daily.   Yes Historical Provider, MD  Cholecalciferol (VITAMIN D-3 PO) Take 5,000 Units by mouth daily.    Yes Historical Provider, MD  citalopram (CELEXA) 10 MG tablet Take 10 mg by mouth daily.   Yes Historical Provider, MD  clonazePAM (KLONOPIN) 0.5 MG tablet Take 0.5 mg by mouth 2 (two) times daily as needed for anxiety.   Yes Historical Provider, MD  Coenzyme Q10 (CO Q 10 PO) Take 100 mg by mouth daily.    Yes Historical Provider, MD  KRILL OIL PO Take 1 tablet by mouth daily.   Yes Historical Provider, MD  rosuvastatin (CRESTOR) 10 MG tablet Take 10 mg by mouth daily.   Yes Historical Provider, MD      Allergies  Allergen Reactions  . Sulfa Antibiotics     ROS:  Out of  a complete 14 system review of symptoms, the patient complains only of the following symptoms, and all other reviewed systems are negative.  Ringing in ears Snoring Blood in the urine Feeling hot Joint pain Confusion, numbness, tremor Depression, anxiety, racing thoughts Insomnia  Blood pressure 140/82, pulse 72, height 5\' 3"  (1.6 m), weight 143 lb 8 oz (65.091 kg).  Physical Exam  General: The patient is alert and cooperative at the time of the examination.  Eyes: Pupils are equal, round, and reactive to light. Discs are flat bilaterally.  Neck: The neck is supple, no carotid bruits are noted.  Respiratory: The respiratory examination is clear.  Cardiovascular: The cardiovascular examination reveals a  regular rate and rhythm, no obvious murmurs or rubs are noted.  Skin: Extremities are without significant edema.  Neurologic Exam  Mental status: The patient is alert and oriented x 3 at the time of the examination. The patient has apparent normal recent and remote memory, with an apparently normal attention span and concentration ability.  Cranial nerves: Facial symmetry is present. There is good sensation of the face to pinprick and soft touch bilaterally. The strength of the facial muscles and the muscles to head turning and shoulder shrug are normal bilaterally. Speech is well enunciated, no aphasia or dysarthria is noted. Extraocular movements are full. Visual fields are full. The tongue is midline, and the patient has symmetric elevation of the soft palate. No obvious hearing deficits are noted. There is a side-to-side head tremor noted.  Motor: The motor testing reveals 5 over 5 strength of all 4 extremities. Good symmetric motor tone is noted throughout.  Sensory: Sensory testing is intact to pinprick, soft touch, vibration sensation, and position sense on all 4 extremities. No evidence of extinction is noted.  Coordination: Cerebellar testing reveals good finger-nose-finger and heel-to-shin bilaterally.  Gait and station: Gait is normal. Tandem gait is normal. Romberg is negative. No drift is seen.  Reflexes: Deep tendon reflexes are symmetric and normal bilaterally. Toes are downgoing bilaterally.   Assessment/Plan:  1. Probable essential tremor  The patient has a side-to-side head tremor. She indicates that this may keep her awake at night at times, she will try clonazepam in the evening hours to see if this reduces the tremor and allows her to sleep better. The patient has been under a lot of stress recently. The patient will contact our office if she is not doing well with controlling the tremor. No further workup will be initiated at this time.  Jill Alexanders MD 02/21/2016  2:17 PM  [facility] [address] [place], [place] [address]  Phone [phone] Fax [phone]

## 2017-04-03 ENCOUNTER — Other Ambulatory Visit: Payer: Self-pay | Admitting: Internal Medicine

## 2017-04-03 DIAGNOSIS — Z1231 Encounter for screening mammogram for malignant neoplasm of breast: Secondary | ICD-10-CM

## 2017-04-14 ENCOUNTER — Ambulatory Visit
Admission: RE | Admit: 2017-04-14 | Discharge: 2017-04-14 | Disposition: A | Discharge status: Home / Self Care | Payer: Federal, State, Local not specified - PPO | Source: Ambulatory Visit | Attending: Internal Medicine | Admitting: Internal Medicine

## 2017-04-14 DIAGNOSIS — Z1231 Encounter for screening mammogram for malignant neoplasm of breast: Secondary | ICD-10-CM | POA: Diagnosis not present

## 2017-04-14 DIAGNOSIS — W57XXXS Bitten or stung by nonvenomous insect and other nonvenomous arthropods, sequela: Secondary | ICD-10-CM | POA: Diagnosis not present

## 2017-04-14 DIAGNOSIS — R21 Rash and other nonspecific skin eruption: Secondary | ICD-10-CM | POA: Diagnosis not present

## 2017-05-23 DIAGNOSIS — R251 Tremor, unspecified: Secondary | ICD-10-CM | POA: Diagnosis not present

## 2017-05-23 DIAGNOSIS — E785 Hyperlipidemia, unspecified: Secondary | ICD-10-CM | POA: Diagnosis not present

## 2017-05-23 DIAGNOSIS — F329 Major depressive disorder, single episode, unspecified: Secondary | ICD-10-CM | POA: Diagnosis not present

## 2017-05-23 DIAGNOSIS — E2839 Other primary ovarian failure: Secondary | ICD-10-CM | POA: Diagnosis not present

## 2017-05-23 DIAGNOSIS — I2510 Atherosclerotic heart disease of native coronary artery without angina pectoris: Secondary | ICD-10-CM | POA: Diagnosis not present

## 2017-05-23 DIAGNOSIS — Z0000 Encounter for general adult medical examination without abnormal findings: Secondary | ICD-10-CM | POA: Diagnosis not present

## 2017-05-23 DIAGNOSIS — M542 Cervicalgia: Secondary | ICD-10-CM | POA: Diagnosis not present

## 2017-05-23 DIAGNOSIS — E559 Vitamin D deficiency, unspecified: Secondary | ICD-10-CM | POA: Diagnosis not present

## 2017-05-25 ENCOUNTER — Other Ambulatory Visit: Payer: Self-pay | Admitting: Internal Medicine

## 2017-05-25 DIAGNOSIS — E2839 Other primary ovarian failure: Secondary | ICD-10-CM

## 2017-06-02 ENCOUNTER — Ambulatory Visit
Admission: RE | Admit: 2017-06-02 | Discharge: 2017-06-02 | Disposition: A | Discharge status: Home / Self Care | Payer: Medicare Other | Source: Ambulatory Visit | Attending: Internal Medicine | Admitting: Internal Medicine

## 2017-06-02 DIAGNOSIS — Z13820 Encounter for screening for osteoporosis: Secondary | ICD-10-CM | POA: Diagnosis not present

## 2017-06-02 DIAGNOSIS — E2839 Other primary ovarian failure: Secondary | ICD-10-CM

## 2017-06-02 DIAGNOSIS — Z780 Asymptomatic menopausal state: Secondary | ICD-10-CM | POA: Diagnosis not present

## 2017-06-29 DIAGNOSIS — Z79899 Other long term (current) drug therapy: Secondary | ICD-10-CM | POA: Diagnosis not present

## 2017-06-29 DIAGNOSIS — F329 Major depressive disorder, single episode, unspecified: Secondary | ICD-10-CM | POA: Diagnosis not present

## 2017-07-19 DIAGNOSIS — N760 Acute vaginitis: Secondary | ICD-10-CM | POA: Diagnosis not present

## 2017-07-19 DIAGNOSIS — Z23 Encounter for immunization: Secondary | ICD-10-CM | POA: Diagnosis not present

## 2017-07-19 DIAGNOSIS — Z01419 Encounter for gynecological examination (general) (routine) without abnormal findings: Secondary | ICD-10-CM | POA: Diagnosis not present

## 2017-11-30 DIAGNOSIS — F329 Major depressive disorder, single episode, unspecified: Secondary | ICD-10-CM | POA: Diagnosis not present

## 2017-11-30 DIAGNOSIS — J019 Acute sinusitis, unspecified: Secondary | ICD-10-CM | POA: Diagnosis not present

## 2017-11-30 DIAGNOSIS — E785 Hyperlipidemia, unspecified: Secondary | ICD-10-CM | POA: Diagnosis not present

## 2017-12-25 ENCOUNTER — Telehealth: Payer: Self-pay

## 2017-12-25 NOTE — Telephone Encounter (Signed)
ERROR

## 2018-02-24 ENCOUNTER — Emergency Department
Admission: EM | Admit: 2018-02-24 | Discharge: 2018-02-24 | Disposition: A | Discharge status: Home / Self Care | Payer: Medicare Other | Attending: Student in an Organized Health Care Education/Training Program | Admitting: Student in an Organized Health Care Education/Training Program

## 2018-02-24 ENCOUNTER — Emergency Department: Payer: Medicare Other

## 2018-02-24 ENCOUNTER — Other Ambulatory Visit: Payer: Self-pay

## 2018-02-24 DIAGNOSIS — Z79899 Other long term (current) drug therapy: Secondary | ICD-10-CM | POA: Insufficient documentation

## 2018-02-24 DIAGNOSIS — Y939 Activity, unspecified: Secondary | ICD-10-CM | POA: Insufficient documentation

## 2018-02-24 DIAGNOSIS — S0990XA Unspecified injury of head, initial encounter: Secondary | ICD-10-CM | POA: Diagnosis not present

## 2018-02-24 DIAGNOSIS — Y929 Unspecified place or not applicable: Secondary | ICD-10-CM | POA: Diagnosis not present

## 2018-02-24 DIAGNOSIS — Y999 Unspecified external cause status: Secondary | ICD-10-CM | POA: Insufficient documentation

## 2018-02-24 DIAGNOSIS — S0101XA Laceration without foreign body of scalp, initial encounter: Secondary | ICD-10-CM | POA: Diagnosis not present

## 2018-02-24 DIAGNOSIS — I10 Essential (primary) hypertension: Secondary | ICD-10-CM | POA: Diagnosis not present

## 2018-02-24 DIAGNOSIS — W19XXXA Unspecified fall, initial encounter: Secondary | ICD-10-CM | POA: Diagnosis not present

## 2018-02-24 DIAGNOSIS — I2510 Atherosclerotic heart disease of native coronary artery without angina pectoris: Secondary | ICD-10-CM | POA: Diagnosis not present

## 2018-02-24 DIAGNOSIS — Z23 Encounter for immunization: Secondary | ICD-10-CM | POA: Diagnosis not present

## 2018-02-24 DIAGNOSIS — S199XXA Unspecified injury of neck, initial encounter: Secondary | ICD-10-CM | POA: Diagnosis not present

## 2018-02-24 DIAGNOSIS — Z87891 Personal history of nicotine dependence: Secondary | ICD-10-CM | POA: Diagnosis not present

## 2018-02-24 MED ORDER — BUTALBITAL-APAP-CAFFEINE 50-325-40 MG PO TABS
1.0000 | ORAL_TABLET | Freq: Once | ORAL | Status: AC
Start: 2018-02-24 — End: 2018-02-24
  Administered 2018-02-24: 1 via ORAL
  Filled 2018-02-24: qty 1

## 2018-02-24 MED ORDER — LIDOCAINE-EPINEPHRINE-TETRACAINE (LET) SOLUTION
3.0000 mL | Freq: Once | NASAL | Status: AC
  Administered 2018-02-24: 3 mL via TOPICAL
  Filled 2018-02-24: qty 3

## 2018-02-24 MED ORDER — TETANUS-DIPHTH-ACELL PERTUSSIS 5-2.5-18.5 LF-MCG/0.5 IM SUSP
0.5000 mL | Freq: Once | INTRAMUSCULAR | Status: AC
  Administered 2018-02-24: 0.5 mL via INTRAMUSCULAR
  Filled 2018-02-24: qty 0.5

## 2018-02-24 MED ORDER — LIDOCAINE HCL (PF) 1 % IJ SOLN
5.0000 mL | Freq: Once | INTRAMUSCULAR | Status: DC
  Filled 2018-02-24: qty 5

## 2018-02-24 NOTE — ED Provider Notes (Signed)
[facility] Emergency Department Provider Note    First MD Initiated Contact with Patient 02/24/18 1745     (approximate)  I have reviewed the triage vital signs and the nursing notes.   HISTORY  Chief Complaint Fall and Laceration    HPI Carolyn Martin is a 67 y.o. female presents after mechanical fall with laceration to the back of her head as well as headache.  Has some superficial scrape to her right forearm and right shin.  Able to walk after the accident.  No prolonged LOC.  Denies any chest pain or shortness of breath.  Is not any blood thinners.  Pain is mild and non radiating.  Past Medical History:  Diagnosis Date  . Anginal pain ([place]) 05/14/2008   atypical chest pain-- Exercise  Myoview  EF 61% ,mod to severe ischemia Basal Anterior ,Anteroseptal,Mid Anterior,Mid Anteroseptal, Apical Anterior and Apical Septal regions   LV normal   . Coronary artery disease 06/23/2010   Echo EF =>55%,LV normal  . Hyperlipidemia   . Hypertension   . Sleep apnea   . Tremor, essential 02/19/2016   Family History  Problem Relation Age of Onset  . Diabetes Mother   . Hypertension Mother   . Transient ischemic attack Father   . Coronary artery disease Father   . Dementia Father    Past Surgical History:  Procedure Laterality Date  . CARDIAC CATHETERIZATION  05/15/2008   2 areas of LAD  . CORONARY ANGIOPLASTY WITH STENT PLACEMENT  05/15/2008    proximal LAD -MiniVision stent 2.5 x 12 and mid LAD 2 overlapping MiniVision 2.0 x 15   Patient Active Problem List   Diagnosis Date Noted  . Tremor, essential 02/19/2016  . Essential hypertension 01/21/2016  . Hyperlipidemia 01/21/2016  . Coronary artery disease due to lipid rich plaque 01/21/2016      Prior to Admission medications   Medication Sig Start Date End Date Taking? Authorizing Provider  Ascorbic Acid (VITAMIN C PO) Take 500 mg by mouth daily.     [provider]  aspirin 81 MG tablet  Take 81 mg by mouth daily.    [provider]  B Complex Vitamins (B COMPLEX 100 PO) Take 100 mg by mouth daily.    [provider]  Cholecalciferol (VITAMIN D-3 PO) Take 5,000 Units by mouth daily.     [provider]  citalopram (CELEXA) 10 MG tablet Take 10 mg by mouth daily.    [provider]  clonazePAM (KLONOPIN) 0.5 MG tablet Take 0.5 mg by mouth 2 (two) times daily as needed for anxiety.    [provider]  Coenzyme Q10 (CO Q 10 PO) Take 100 mg by mouth daily.     [provider]  KRILL OIL PO Take 1 tablet by mouth daily.    [provider]  rosuvastatin (CRESTOR) 10 MG tablet Take 10 mg by mouth daily.    [provider]    Allergies Sulfa antibiotics    Social History Social History   Tobacco Use  . Smoking status: Former Smoker    Types: Cigarettes    Last attempt to quit: 12/17/2006    Years since quitting: 11.1  . Smokeless tobacco: Never Used  Substance Use Topics  . Alcohol use: No    Alcohol/week: 0.0 oz    Comment: Occ 2 oz wine  . Drug use: No    Review of Systems Patient denies headaches, rhinorrhea, blurry vision, numbness, shortness  of breath, chest pain, edema, cough, abdominal pain, nausea, vomiting, diarrhea, dysuria, fevers, rashes or hallucinations unless otherwise stated above in HPI. ____________________________________________   PHYSICAL EXAM:  VITAL SIGNS: Vitals:   02/24/18 1830 02/24/18 1845  BP: 113/89 128/69  Pulse: 68 68  Resp:  18  Temp:    SpO2: 97% 97%    Constitutional: Alert and oriented. Well appearing and in no acute distress. Eyes: Conjunctivae are normal.  Head: 2 cm laceration to the left parietal scalp.  No evidence of basilar skull fracture.  No deformity. Nose: No congestion/rhinnorhea. Mouth/Throat: Mucous membranes are moist.   Neck: No stridor. Painless ROM.  Cardiovascular: Normal rate, regular rhythm. Grossly normal heart sounds.  Good  peripheral circulation. Respiratory: Normal respiratory effort.  No retractions. Lungs CTAB. Gastrointestinal: Soft and nontender. No distention. No abdominal bruits. No CVA tenderness. Genitourinary:  Musculoskeletal: No lower extremity tenderness nor edema.  No joint effusions. Neurologic:  Normal speech and language. No gross focal neurologic deficits are appreciated. No facial droop Skin:  Skin is warm, dry and intact. Superficial abrasion to right shin and forearm Psychiatric: Mood and affect are normal. Speech and behavior are normal.  ____________________________________________   LABS (all labs ordered are listed, but only abnormal results are displayed)  No results found for this or any previous visit (from the past 24 hour(s)). ____________________________________________ ____________________________________________  OACZYSAYT  I personally reviewed all radiographic images ordered to evaluate for the above acute complaints and reviewed radiology reports and findings.  These findings were personally discussed with the patient.  Please see medical record for radiology report.  ____________________________________________   PROCEDURES  Procedure(s) performed:  Marland KitchenMarland KitchenLaceration Repair Date/Time: 02/24/2018 7:07 PM Performed by: Merlyn Lot, MD Authorized by: Merlyn Lot, MD   Consent:    Consent obtained:  Verbal   Consent given by:  Patient   Risks discussed:  Infection, pain, retained foreign body, poor cosmetic result and poor wound healing Anesthesia (see MAR for exact dosages):    Anesthesia method:  Local infiltration and topical application   Topical anesthetic:  LET   Local anesthetic:  Lidocaine 1% w/o epi Laceration details:    Location:  Scalp Repair type:    Repair type:  Simple Exploration:    Hemostasis achieved with:  Direct pressure   Wound exploration: entire depth of wound probed and visualized     Contaminated: no   Treatment:    Area  cleansed with:  Hibiclens   Amount of cleaning:  Extensive   Irrigation solution:  Sterile saline   Visualized foreign bodies/material removed: no   Skin repair:    Repair method:  Staples   Number of staples:  4 Approximation:    Approximation:  Close Post-procedure details:    Dressing:  Sterile dressing   Patient tolerance of procedure:  Tolerated well, no immediate complications      Critical Care performed: no ____________________________________________   INITIAL IMPRESSION / ASSESSMENT AND PLAN / ED COURSE  Pertinent labs & imaging results that were available during my care of the patient were reviewed by me and considered in my medical decision making (see chart for details).  DDX: laceration, contusion, abrasion  LASHEKA KEMPNER is a 67 y.o. who presents to the ED with injury after mechanical fall as described above.  Patient nontoxic-appearing based on high risk presentation CT imaging ordered to exclude intracranial abnormality.  CT imaging shows no evidence of hemorrhage or hematoma.  No evidence of skull fracture.  Tetanus was updated.  Laceration explored and repaired with staples as described above.  Patient able to ambulate with steady gait.  No indication for further diagnostic imaging based on physical exam.      As part of my medical decision making, I reviewed the following data within the Agenda notes reviewed and incorporated, Labs reviewed, notes from prior ED visits.  ____________________________________________   FINAL CLINICAL IMPRESSION(S) / ED DIAGNOSES  Final diagnoses:  Injury of head, initial encounter  Laceration of scalp, initial encounter      NEW MEDICATIONS STARTED DURING THIS VISIT:  New Prescriptions   No medications on file     Note:  This document was prepared using Dragon voice recognition software and may include unintentional dictation errors.    Merlyn Lot, MD 02/24/18 Einar Crow

## 2018-02-24 NOTE — ED Notes (Signed)
Report given to Jenna RN

## 2018-02-24 NOTE — ED Notes (Signed)
Reviewed discharge instructions and follow-up care with patient. Patient verbalized understanding of all information reviewed. Patient stable, with no distress noted at this time.    

## 2018-02-24 NOTE — ED Triage Notes (Addendum)
Pt slipped going down the stairs and hit the back of her head on a step, pt has a lac to the back of her head, pt denies loc or neck pain, some headache. Pt has a small abrasion to the right lower shin and rt inner forearm. Pt also reports an unusual sensation like numbness to the left side of her face and pain at the injured site

## 2018-02-26 DIAGNOSIS — D2262 Melanocytic nevi of left upper limb, including shoulder: Secondary | ICD-10-CM | POA: Diagnosis not present

## 2018-02-26 DIAGNOSIS — D225 Melanocytic nevi of trunk: Secondary | ICD-10-CM | POA: Diagnosis not present

## 2018-02-26 DIAGNOSIS — D2261 Melanocytic nevi of right upper limb, including shoulder: Secondary | ICD-10-CM | POA: Diagnosis not present

## 2018-02-26 DIAGNOSIS — L821 Other seborrheic keratosis: Secondary | ICD-10-CM | POA: Diagnosis not present

## 2018-02-26 DIAGNOSIS — L309 Dermatitis, unspecified: Secondary | ICD-10-CM | POA: Diagnosis not present

## 2018-03-06 DIAGNOSIS — Z09 Encounter for follow-up examination after completed treatment for conditions other than malignant neoplasm: Secondary | ICD-10-CM | POA: Diagnosis not present

## 2018-03-26 DIAGNOSIS — M25512 Pain in left shoulder: Secondary | ICD-10-CM | POA: Diagnosis not present

## 2018-03-26 DIAGNOSIS — Z1211 Encounter for screening for malignant neoplasm of colon: Secondary | ICD-10-CM | POA: Diagnosis not present

## 2018-03-26 DIAGNOSIS — Z800 Family history of malignant neoplasm of digestive organs: Secondary | ICD-10-CM | POA: Diagnosis not present

## 2018-03-26 DIAGNOSIS — M542 Cervicalgia: Secondary | ICD-10-CM | POA: Diagnosis not present

## 2018-04-24 DIAGNOSIS — Z800 Family history of malignant neoplasm of digestive organs: Secondary | ICD-10-CM | POA: Diagnosis not present

## 2018-04-24 DIAGNOSIS — Z1211 Encounter for screening for malignant neoplasm of colon: Secondary | ICD-10-CM | POA: Diagnosis not present

## 2018-04-24 DIAGNOSIS — E738 Other lactose intolerance: Secondary | ICD-10-CM | POA: Diagnosis not present

## 2018-04-27 DIAGNOSIS — M5412 Radiculopathy, cervical region: Secondary | ICD-10-CM | POA: Diagnosis not present

## 2018-04-27 DIAGNOSIS — R293 Abnormal posture: Secondary | ICD-10-CM | POA: Diagnosis not present

## 2018-04-27 DIAGNOSIS — R531 Weakness: Secondary | ICD-10-CM | POA: Diagnosis not present

## 2018-04-27 DIAGNOSIS — M542 Cervicalgia: Secondary | ICD-10-CM | POA: Diagnosis not present

## 2018-05-03 DIAGNOSIS — M5412 Radiculopathy, cervical region: Secondary | ICD-10-CM | POA: Diagnosis not present

## 2018-05-03 DIAGNOSIS — R293 Abnormal posture: Secondary | ICD-10-CM | POA: Diagnosis not present

## 2018-05-03 DIAGNOSIS — R531 Weakness: Secondary | ICD-10-CM | POA: Diagnosis not present

## 2018-05-03 DIAGNOSIS — M542 Cervicalgia: Secondary | ICD-10-CM | POA: Diagnosis not present

## 2018-05-08 DIAGNOSIS — M542 Cervicalgia: Secondary | ICD-10-CM | POA: Diagnosis not present

## 2018-05-08 DIAGNOSIS — R531 Weakness: Secondary | ICD-10-CM | POA: Diagnosis not present

## 2018-05-08 DIAGNOSIS — R293 Abnormal posture: Secondary | ICD-10-CM | POA: Diagnosis not present

## 2018-05-08 DIAGNOSIS — M5412 Radiculopathy, cervical region: Secondary | ICD-10-CM | POA: Diagnosis not present

## 2018-05-11 DIAGNOSIS — R293 Abnormal posture: Secondary | ICD-10-CM | POA: Diagnosis not present

## 2018-05-11 DIAGNOSIS — R531 Weakness: Secondary | ICD-10-CM | POA: Diagnosis not present

## 2018-05-11 DIAGNOSIS — M542 Cervicalgia: Secondary | ICD-10-CM | POA: Diagnosis not present

## 2018-05-11 DIAGNOSIS — M5412 Radiculopathy, cervical region: Secondary | ICD-10-CM | POA: Diagnosis not present

## 2018-05-18 ENCOUNTER — Other Ambulatory Visit: Payer: Self-pay | Admitting: Internal Medicine

## 2018-05-18 ENCOUNTER — Other Ambulatory Visit: Payer: Self-pay

## 2018-05-18 DIAGNOSIS — Z1231 Encounter for screening mammogram for malignant neoplasm of breast: Secondary | ICD-10-CM

## 2018-05-18 DIAGNOSIS — M5412 Radiculopathy, cervical region: Secondary | ICD-10-CM | POA: Diagnosis not present

## 2018-05-18 DIAGNOSIS — M542 Cervicalgia: Secondary | ICD-10-CM | POA: Diagnosis not present

## 2018-05-18 DIAGNOSIS — R293 Abnormal posture: Secondary | ICD-10-CM | POA: Diagnosis not present

## 2018-05-18 DIAGNOSIS — R531 Weakness: Secondary | ICD-10-CM | POA: Diagnosis not present

## 2018-05-24 DIAGNOSIS — M542 Cervicalgia: Secondary | ICD-10-CM | POA: Diagnosis not present

## 2018-05-24 DIAGNOSIS — R531 Weakness: Secondary | ICD-10-CM | POA: Diagnosis not present

## 2018-05-24 DIAGNOSIS — M5412 Radiculopathy, cervical region: Secondary | ICD-10-CM | POA: Diagnosis not present

## 2018-05-24 DIAGNOSIS — R293 Abnormal posture: Secondary | ICD-10-CM | POA: Diagnosis not present

## 2018-05-30 ENCOUNTER — Encounter: Payer: Self-pay | Admitting: Internal Medicine

## 2018-05-30 DIAGNOSIS — Z800 Family history of malignant neoplasm of digestive organs: Secondary | ICD-10-CM | POA: Diagnosis not present

## 2018-05-30 DIAGNOSIS — D125 Benign neoplasm of sigmoid colon: Secondary | ICD-10-CM | POA: Diagnosis not present

## 2018-05-30 DIAGNOSIS — Z1211 Encounter for screening for malignant neoplasm of colon: Secondary | ICD-10-CM | POA: Diagnosis not present

## 2018-05-30 DIAGNOSIS — D122 Benign neoplasm of ascending colon: Secondary | ICD-10-CM | POA: Diagnosis not present

## 2018-05-30 DIAGNOSIS — K635 Polyp of colon: Secondary | ICD-10-CM | POA: Diagnosis not present

## 2018-05-30 DIAGNOSIS — D120 Benign neoplasm of cecum: Secondary | ICD-10-CM | POA: Diagnosis not present

## 2018-05-31 DIAGNOSIS — N182 Chronic kidney disease, stage 2 (mild): Secondary | ICD-10-CM | POA: Diagnosis not present

## 2018-05-31 DIAGNOSIS — I129 Hypertensive chronic kidney disease with stage 1 through stage 4 chronic kidney disease, or unspecified chronic kidney disease: Secondary | ICD-10-CM | POA: Diagnosis not present

## 2018-05-31 LAB — BASIC METABOLIC PANEL
BUN: 10 (ref 4–21)
Creatinine: 0.8 (ref 0.5–1.1)
GLUCOSE: 76
Potassium: 4.9 (ref 3.4–5.3)
SODIUM: 143 (ref 137–147)

## 2018-05-31 LAB — HEPATIC FUNCTION PANEL
ALK PHOS: 76 (ref 25–125)
ALT: 18 (ref 7–35)
AST: 18 (ref 13–35)
Bilirubin, Total: 0.3

## 2018-05-31 LAB — LIPID PANEL
Cholesterol: 166 (ref 0–200)
HDL: 64 (ref 35–70)
LDL Cholesterol: 77
Triglycerides: 125 (ref 40–160)

## 2018-06-08 DIAGNOSIS — R293 Abnormal posture: Secondary | ICD-10-CM | POA: Diagnosis not present

## 2018-06-08 DIAGNOSIS — M542 Cervicalgia: Secondary | ICD-10-CM | POA: Diagnosis not present

## 2018-06-08 DIAGNOSIS — M5412 Radiculopathy, cervical region: Secondary | ICD-10-CM | POA: Diagnosis not present

## 2018-06-08 DIAGNOSIS — R531 Weakness: Secondary | ICD-10-CM | POA: Diagnosis not present

## 2018-06-13 ENCOUNTER — Ambulatory Visit
Admission: RE | Admit: 2018-06-13 | Discharge: 2018-06-13 | Disposition: A | Discharge status: Home / Self Care | Payer: Medicare Other | Source: Ambulatory Visit | Attending: Internal Medicine | Admitting: Internal Medicine

## 2018-06-13 DIAGNOSIS — Z1231 Encounter for screening mammogram for malignant neoplasm of breast: Secondary | ICD-10-CM | POA: Diagnosis not present

## 2018-06-14 DIAGNOSIS — M5412 Radiculopathy, cervical region: Secondary | ICD-10-CM | POA: Diagnosis not present

## 2018-06-14 DIAGNOSIS — R293 Abnormal posture: Secondary | ICD-10-CM | POA: Diagnosis not present

## 2018-06-14 DIAGNOSIS — R531 Weakness: Secondary | ICD-10-CM | POA: Diagnosis not present

## 2018-06-14 DIAGNOSIS — M542 Cervicalgia: Secondary | ICD-10-CM | POA: Diagnosis not present

## 2018-06-21 DIAGNOSIS — R531 Weakness: Secondary | ICD-10-CM | POA: Diagnosis not present

## 2018-06-21 DIAGNOSIS — R293 Abnormal posture: Secondary | ICD-10-CM | POA: Diagnosis not present

## 2018-06-21 DIAGNOSIS — M5412 Radiculopathy, cervical region: Secondary | ICD-10-CM | POA: Diagnosis not present

## 2018-06-21 DIAGNOSIS — M542 Cervicalgia: Secondary | ICD-10-CM | POA: Diagnosis not present

## 2018-06-22 DIAGNOSIS — M5412 Radiculopathy, cervical region: Secondary | ICD-10-CM | POA: Diagnosis not present

## 2018-06-22 DIAGNOSIS — R531 Weakness: Secondary | ICD-10-CM | POA: Diagnosis not present

## 2018-06-22 DIAGNOSIS — R293 Abnormal posture: Secondary | ICD-10-CM | POA: Diagnosis not present

## 2018-06-22 DIAGNOSIS — M542 Cervicalgia: Secondary | ICD-10-CM | POA: Diagnosis not present

## 2018-06-26 DIAGNOSIS — R531 Weakness: Secondary | ICD-10-CM | POA: Diagnosis not present

## 2018-06-26 DIAGNOSIS — R293 Abnormal posture: Secondary | ICD-10-CM | POA: Diagnosis not present

## 2018-06-26 DIAGNOSIS — M542 Cervicalgia: Secondary | ICD-10-CM | POA: Diagnosis not present

## 2018-06-26 DIAGNOSIS — M5412 Radiculopathy, cervical region: Secondary | ICD-10-CM | POA: Diagnosis not present

## 2018-06-28 DIAGNOSIS — M542 Cervicalgia: Secondary | ICD-10-CM | POA: Diagnosis not present

## 2018-06-28 DIAGNOSIS — R293 Abnormal posture: Secondary | ICD-10-CM | POA: Diagnosis not present

## 2018-06-28 DIAGNOSIS — R531 Weakness: Secondary | ICD-10-CM | POA: Diagnosis not present

## 2018-06-28 DIAGNOSIS — M5412 Radiculopathy, cervical region: Secondary | ICD-10-CM | POA: Diagnosis not present

## 2018-07-03 DIAGNOSIS — M5412 Radiculopathy, cervical region: Secondary | ICD-10-CM | POA: Diagnosis not present

## 2018-07-03 DIAGNOSIS — R531 Weakness: Secondary | ICD-10-CM | POA: Diagnosis not present

## 2018-07-03 DIAGNOSIS — R293 Abnormal posture: Secondary | ICD-10-CM | POA: Diagnosis not present

## 2018-07-03 DIAGNOSIS — M542 Cervicalgia: Secondary | ICD-10-CM | POA: Diagnosis not present

## 2018-07-05 DIAGNOSIS — M5412 Radiculopathy, cervical region: Secondary | ICD-10-CM | POA: Diagnosis not present

## 2018-07-05 DIAGNOSIS — R531 Weakness: Secondary | ICD-10-CM | POA: Diagnosis not present

## 2018-07-05 DIAGNOSIS — M542 Cervicalgia: Secondary | ICD-10-CM | POA: Diagnosis not present

## 2018-07-05 DIAGNOSIS — R293 Abnormal posture: Secondary | ICD-10-CM | POA: Diagnosis not present

## 2018-07-07 ENCOUNTER — Encounter: Payer: Self-pay | Admitting: Internal Medicine

## 2018-07-07 DIAGNOSIS — N182 Chronic kidney disease, stage 2 (mild): Secondary | ICD-10-CM

## 2018-07-10 DIAGNOSIS — M5412 Radiculopathy, cervical region: Secondary | ICD-10-CM | POA: Diagnosis not present

## 2018-07-10 DIAGNOSIS — M542 Cervicalgia: Secondary | ICD-10-CM | POA: Diagnosis not present

## 2018-07-10 DIAGNOSIS — R531 Weakness: Secondary | ICD-10-CM | POA: Diagnosis not present

## 2018-07-10 DIAGNOSIS — R293 Abnormal posture: Secondary | ICD-10-CM | POA: Diagnosis not present

## 2018-07-17 DIAGNOSIS — R293 Abnormal posture: Secondary | ICD-10-CM | POA: Diagnosis not present

## 2018-07-17 DIAGNOSIS — M5412 Radiculopathy, cervical region: Secondary | ICD-10-CM | POA: Diagnosis not present

## 2018-07-17 DIAGNOSIS — M542 Cervicalgia: Secondary | ICD-10-CM | POA: Diagnosis not present

## 2018-07-17 DIAGNOSIS — R531 Weakness: Secondary | ICD-10-CM | POA: Diagnosis not present

## 2018-07-18 ENCOUNTER — Encounter: Payer: Self-pay | Admitting: Internal Medicine

## 2018-07-18 ENCOUNTER — Ambulatory Visit (INDEPENDENT_AMBULATORY_CARE_PROVIDER_SITE_OTHER): Payer: Medicare Other | Admitting: Internal Medicine

## 2018-07-18 VITALS — BP 132/84 | HR 67 | Temp 97.4°F | Ht 62.5 in | Wt 130.6 lb

## 2018-07-18 DIAGNOSIS — Z79899 Other long term (current) drug therapy: Secondary | ICD-10-CM | POA: Diagnosis not present

## 2018-07-18 DIAGNOSIS — G250 Essential tremor: Secondary | ICD-10-CM | POA: Diagnosis not present

## 2018-07-18 DIAGNOSIS — F419 Anxiety disorder, unspecified: Secondary | ICD-10-CM | POA: Insufficient documentation

## 2018-07-18 DIAGNOSIS — I2510 Atherosclerotic heart disease of native coronary artery without angina pectoris: Secondary | ICD-10-CM | POA: Diagnosis not present

## 2018-07-18 DIAGNOSIS — E7800 Pure hypercholesterolemia, unspecified: Secondary | ICD-10-CM

## 2018-07-18 DIAGNOSIS — E559 Vitamin D deficiency, unspecified: Secondary | ICD-10-CM

## 2018-07-18 DIAGNOSIS — I2583 Coronary atherosclerosis due to lipid rich plaque: Secondary | ICD-10-CM

## 2018-07-18 MED ORDER — CLONAZEPAM 0.5 MG PO TABS: 0.5000 mg | ORAL_TABLET | Freq: Two times a day (BID) | ORAL | 3 refills | Status: DC | PRN

## 2018-07-18 MED ORDER — VORTIOXETINE HBR 10 MG PO TABS: 10.0000 mg | ORAL_TABLET | Freq: Every day | ORAL | 0 refills | Status: DC

## 2018-07-18 NOTE — Progress Notes (Signed)
   Subjective:    Patient ID: Carolyn Martin, female    DOB: [birth year], 67 y.o.   MRN: [id number]  Hyperlipidemia  This is a chronic problem. The current episode started more than 1 year ago. Lipid results: CONTROLLED W/ MEDS. Current antihyperlipidemic treatment includes statins. The current treatment provides significant improvement of lipids. There are no compliance problems.       Review of Systems  Constitutional: Negative.   HENT: Negative.   Eyes: Negative.   Cardiovascular: Negative.   Gastrointestinal: Negative.   Endocrine: Negative.   Musculoskeletal: Negative.    Past Medical History:  Diagnosis Date  . Anginal pain ([place]) 05/14/2008   atypical chest pain-- Exercise  Myoview  EF 61% ,mod to severe ischemia Basal Anterior ,Anteroseptal,Mid Anterior,Mid Anteroseptal, Apical Anterior and Apical Septal regions   LV normal   . Coronary artery disease 06/23/2010   Echo EF =>55%,LV normal  . Hyperlipidemia   . Hypertension   . Sleep apnea   . Tremor, essential 02/19/2016      Vitals:   07/18/18 0911  BP: 132/84  Pulse: 67  Temp: (!) 97.4 F (36.3 C)   Vitals:   07/18/18 0911  Weight: 130 lb 9.6 oz (59.2 kg)  Height: 5' 2.5" (1.588 m)    Objective:   Physical Exam  Constitutional: She is oriented to person, place, and time. She appears well-developed and well-nourished.  Neck: Normal range of motion. Neck supple.  Cardiovascular: Normal rate, regular rhythm, normal heart sounds and intact distal pulses.  Pulmonary/Chest: Effort normal and breath sounds normal.  Neurological: She is alert and oriented to person, place, and time.  Psychiatric: She has a normal mood and affect.          Assessment & Plan:  Pure hypercholesterolemia - SHE FEELS WELL ON MEDICATION.  SHE WILL CONTINUE WITH ROSUVASTATIN. I WILL CHECK LIPID PANEL TODAY.  - Plan: Lipid Profile  Coronary artery disease due to lipid rich plaque - CHRONIC, YET STABLE. SHE PLANS TO SCHEDULE F/U APPT  W/ CARDIO IN JAN 2020.  Tremor, essential - CHRONIC. SHE HAS BEEN PRESCRIBED CLONAZEPAM BY NEURO IN THE PAST. I WILL SEND HER A REFILL. SHE WILL ALSO START MG SUPPLEMENTATION.   Vitamin D insufficiency - SHE IS CURRENTLY TAKING 2000 UNITS ONCE DAILY.  I WILL CHECK A VIT D LEVEL AND SUPPLEMENT AS NEEDED.  - Plan: Vitamin D (25 hydroxy)  Encounter for long-term (current) use of medications - Plan: CMP14+EGFR

## 2018-07-18 NOTE — Patient Instructions (Signed)
Place hyperlipidemia patient instructions here.  

## 2018-07-19 DIAGNOSIS — R531 Weakness: Secondary | ICD-10-CM | POA: Diagnosis not present

## 2018-07-19 DIAGNOSIS — R293 Abnormal posture: Secondary | ICD-10-CM | POA: Diagnosis not present

## 2018-07-19 DIAGNOSIS — M5412 Radiculopathy, cervical region: Secondary | ICD-10-CM | POA: Diagnosis not present

## 2018-07-19 DIAGNOSIS — M542 Cervicalgia: Secondary | ICD-10-CM | POA: Diagnosis not present

## 2018-07-19 LAB — CMP14+EGFR
A/G RATIO: 2 (ref 1.2–2.2)
ALBUMIN: 4.6 g/dL (ref 3.6–4.8)
ALT: 15 IU/L (ref 0–32)
AST: 20 IU/L (ref 0–40)
Alkaline Phosphatase: 80 IU/L (ref 39–117)
BILIRUBIN TOTAL: 0.5 mg/dL (ref 0.0–1.2)
BUN / CREAT RATIO: 26 (ref 12–28)
BUN: 21 mg/dL (ref 8–27)
CALCIUM: 9.5 mg/dL (ref 8.7–10.3)
CHLORIDE: 103 mmol/L (ref 96–106)
CO2: 27 mmol/L (ref 20–29)
Creatinine, Ser: 0.8 mg/dL (ref 0.57–1.00)
GFR, EST AFRICAN AMERICAN: 89 mL/min/{1.73_m2} (ref >59)
GFR, EST NON AFRICAN AMERICAN: 77 mL/min/{1.73_m2} (ref >59)
Globulin, Total: 2.3 g/dL (ref 1.5–4.5)
Glucose: 81 mg/dL (ref 65–99)
POTASSIUM: 4.6 mmol/L (ref 3.5–5.2)
Sodium: 144 mmol/L (ref 134–144)
Total Protein: 6.9 g/dL (ref 6.0–8.5)

## 2018-07-19 LAB — LIPID PANEL
CHOL/HDL RATIO: 3.2 ratio (ref 0.0–4.4)
Cholesterol, Total: 186 mg/dL (ref 100–199)
HDL: 58 mg/dL (ref >39)
LDL Calculated: 98 mg/dL (ref 0–99)
Triglycerides: 148 mg/dL (ref 0–149)
VLDL CHOLESTEROL CAL: 30 mg/dL (ref 5–40)

## 2018-07-19 LAB — VITAMIN D 25 HYDROXY (VIT D DEFICIENCY, FRACTURES): VIT D 25 HYDROXY: 34.6 ng/mL (ref 30.0–100.0)

## 2018-07-22 NOTE — Progress Notes (Signed)
Your liver and kidney function are stable. Your cholesterol is pretty good. Your triglycerides are slightly elevated, be sure to avoid processed foods and exercise no less than four days weekly for at least 30 minutes. Your vitamin D level is on the low end. If you are not taking any vit d, start 2000 units once daily.

## 2018-08-03 DIAGNOSIS — M5412 Radiculopathy, cervical region: Secondary | ICD-10-CM | POA: Diagnosis not present

## 2018-08-03 DIAGNOSIS — R293 Abnormal posture: Secondary | ICD-10-CM | POA: Diagnosis not present

## 2018-08-03 DIAGNOSIS — R531 Weakness: Secondary | ICD-10-CM | POA: Diagnosis not present

## 2018-08-03 DIAGNOSIS — M542 Cervicalgia: Secondary | ICD-10-CM | POA: Diagnosis not present

## 2018-10-24 ENCOUNTER — Encounter: Payer: Self-pay | Admitting: Cardiovascular Disease

## 2018-10-24 ENCOUNTER — Ambulatory Visit (INDEPENDENT_AMBULATORY_CARE_PROVIDER_SITE_OTHER): Payer: Medicare Other | Admitting: Cardiovascular Disease

## 2018-10-24 VITALS — BP 114/82 | HR 61 | Ht 62.5 in | Wt 131.0 lb

## 2018-10-24 DIAGNOSIS — E782 Mixed hyperlipidemia: Secondary | ICD-10-CM

## 2018-10-24 DIAGNOSIS — I2510 Atherosclerotic heart disease of native coronary artery without angina pectoris: Secondary | ICD-10-CM | POA: Diagnosis not present

## 2018-10-24 DIAGNOSIS — I10 Essential (primary) hypertension: Secondary | ICD-10-CM | POA: Diagnosis not present

## 2018-10-24 DIAGNOSIS — I2583 Coronary atherosclerosis due to lipid rich plaque: Secondary | ICD-10-CM | POA: Diagnosis not present

## 2018-10-24 MED ORDER — ROSUVASTATIN CALCIUM 20 MG PO TABS: 20.0000 mg | ORAL_TABLET | Freq: Every day | ORAL | 3 refills | Status: DC

## 2018-10-24 NOTE — Patient Instructions (Signed)
Medication Instructions:  INCREASE YOUR ROSUVASTATIN (CRESTOR) TO 20 MG BY MOUTH DAILY  If you need a refill on your cardiac medications before your next appointment, please call your pharmacy.   Lab work: Your physician recommends that you FOLLOW UP WITH YOUR PCP IN April FOR LAB WORK (LIPID/LIVER PANELS) If you have labs (blood work) drawn today and your tests are completely normal, you will receive your results only by: Marland Kitchen MyChart Message (if you have MyChart) OR . A paper copy in the mail If you have any lab test that is abnormal or we need to change your treatment, we will call you to review the results.  Testing/Procedures: NONE  Follow-Up: At [facility] you and your health needs are our priority.  As part of our continuing mission to provide you with exceptional heart care, we have created designated Provider Care Teams.  These Care Teams include your primary Cardiologist (physician) and Advanced Practice Providers (APPs -  Physician Assistants and Nurse Practitioners) who all work together to provide you with the care you need, when you need it. . You will need a follow up appointment in 12 months.  Please call our office 2 months in advance to schedule this appointment.  You may see Dr. Gwenlyn Found or one of the following Advanced Practice Providers on your designated Care Team:   . Kerin Ransom, [place] . Almyra Deforest, PA-C . Fabian Sharp, PA-C . Jory Sims, DNP . Rosaria Ferries, PA-C . Roby Lofts, PA-C . Sande Rives, PA-C

## 2018-10-24 NOTE — Assessment & Plan Note (Signed)
History of CAD status post LAD stenting by Dr. Melvern Banker using 3 sequential bare-metal stents 05/15/2008.  Because of chest pain when I saw her back in 2017 I obtained a Myoview stress test on 02/05/2016 which was low risk.  She currently denies chest pain or shortness of breath.

## 2018-10-24 NOTE — Assessment & Plan Note (Signed)
History of essential hypertension blood pressure measured today 114/82.  She is not on antihypertensive medications.

## 2018-10-24 NOTE — Progress Notes (Signed)
10/24/2018 Caryl Comes   16-Jan-1951  [id number]  Primary Physician Glendale Chard, MD Primary Cardiologist: Lorretta Harp MD Lupe Carney, [place]  HPI:  Carolyn Martin is a 68 y.o.  thin-appearing married Caucasian female mother of 2, grandmother 2 grandchildren who we saw in our practice remotely. She is referred back to be reestablished because of occasional chest pain. She is retired from working at the [employer] for 32 years.  I last saw her in the office 01/21/2016 risk factors include treated hypertension and hyperlipidemia. Her mother did have coronary artery bypass grafting in her 15s. She had LAD stenting, Dr. Melvern Banker using 3 sequential bare-metal stents 05/15/08.   Because of occasional chest pain when I saw her last I obtained a Myoview stress test 4/21 717 which was entirely normal.  She has had no recurrent symptoms.  Her most recent lab work performed 07/18/2018 revealed an LDL of 98 on Crestor 10 mg a day.   Current Meds  Medication Sig  . Ascorbic Acid (VITAMIN C PO) Take 500 mg by mouth daily.   . B Complex Vitamins (B COMPLEX 100 PO) Take 100 mg by mouth daily.  . Cholecalciferol (VITAMIN D-3 PO) Take 5,000 Units by mouth daily.   . clonazePAM (KLONOPIN) 0.5 MG tablet Take 1 tablet (0.5 mg total) by mouth 2 (two) times daily as needed for anxiety.  . Coenzyme Q10 (CO Q 10 PO) Take 100 mg by mouth daily.   . rosuvastatin (CRESTOR) 20 MG tablet Take 1 tablet (20 mg total) by mouth daily.  . [DISCONTINUED] rosuvastatin (CRESTOR) 10 MG tablet Take 10 mg by mouth daily.     Allergies  Allergen Reactions  . Iodine Hives  . Sulfa Antibiotics     Social History   Socioeconomic History  . Marital status: Married    Spouse name: Hendricks Milo  . Number of children: 2  . Years of education: 37  . Highest education level: Not on file  Occupational History  . Occupation: Retired    Fish farm manager: [employer]  Social Needs  . Financial resource strain: Not on file    . Food insecurity:    Worry: Not on file    Inability: Not on file  . Transportation needs:    Medical: Not on file    Non-medical: Not on file  Tobacco Use  . Smoking status: Former Smoker    Types: Cigarettes    Last attempt to quit: 12/17/2006    Years since quitting: 11.8  . Smokeless tobacco: Never Used  Substance and Sexual Activity  . Alcohol use: No    Alcohol/week: 0.0 standard drinks    Comment: Occ 2 oz wine  . Drug use: No  . Sexual activity: Not on file  Lifestyle  . Physical activity:    Days per week: Not on file    Minutes per session: Not on file  . Stress: Not on file  Relationships  . Social connections:    Talks on phone: Not on file    Gets together: Not on file    Attends religious service: Not on file    Active member of club or organization: Not on file    Attends meetings of clubs or organizations: Not on file    Relationship status: Not on file  . Intimate partner violence:    Fear of current or ex partner: Not on file    Emotionally abused: Not on file  Physically abused: Not on file    Forced sexual activity: Not on file  Other Topics Concern  . Not on file  Social History Narrative   Lives w/ husband   Right-handed   Drinks about 12 oz caffeinated beverage per day     Review of Systems: General: negative for chills, fever, night sweats or weight changes.  Cardiovascular: negative for chest pain, dyspnea on exertion, edema, orthopnea, palpitations, paroxysmal nocturnal dyspnea or shortness of breath Dermatological: negative for rash Respiratory: negative for cough or wheezing Urologic: negative for hematuria Abdominal: negative for nausea, vomiting, diarrhea, bright red blood per rectum, melena, or hematemesis Neurologic: negative for visual changes, syncope, or dizziness All other systems reviewed and are otherwise negative except as noted above.    Blood pressure 114/82, pulse 61, height 5' 2.5" (1.588 m), weight 131 lb (59.4  kg).  General appearance: alert and no distress Neck: no adenopathy, no carotid bruit, no JVD, supple, symmetrical, trachea midline and thyroid not enlarged, symmetric, no tenderness/mass/nodules Lungs: clear to auscultation bilaterally Heart: regular rate and rhythm, S1, S2 normal, no murmur, click, rub or gallop Extremities: extremities normal, atraumatic, no cyanosis or edema Pulses: 2+ and symmetric Skin: Skin color, texture, turgor normal. No rashes or lesions Neurologic: Alert and oriented X 3, normal strength and tone. Normal symmetric reflexes. Normal coordination and gait  EKG sinus rhythm at 61 without ST or T wave changes.  Personally reviewed this EKG.  ASSESSMENT AND PLAN:   Essential hypertension History of essential hypertension blood pressure measured today 114/82.  She is not on antihypertensive medications.  Hyperlipidemia History of hyperlipidemia on low-dose statin therapy with recent lipid profile performed 07/18/2018 revealing total cholesterol 186, LDL 98 and HDL 58.  Based on her prior history of CAD with the goal of having her LDL less than 70 for secondary prevention I am going to increase her right side simvastatin from 10 to 20 mg a day I will have Dr. Baird Cancer recheck that during her annual visit in April.  Coronary artery disease due to lipid rich plaque History of CAD status post LAD stenting by Dr. Melvern Banker using 3 sequential bare-metal stents 05/15/2008.  Because of chest pain when I saw her back in 2017 I obtained a Myoview stress test on 02/05/2016 which was low risk.  She currently denies chest pain or shortness of breath.      Lorretta Harp MD FACP,FACC,FAHA, [facility] 10/24/2018 9:35 AM

## 2018-10-24 NOTE — Assessment & Plan Note (Signed)
History of hyperlipidemia on low-dose statin therapy with recent lipid profile performed 07/18/2018 revealing total cholesterol 186, LDL 98 and HDL 58.  Based on her prior history of CAD with the goal of having her LDL less than 70 for secondary prevention I am going to increase her right side simvastatin from 10 to 20 mg a day I will have Dr. Baird Cancer recheck that during her annual visit in April.

## 2018-10-29 ENCOUNTER — Telehealth: Payer: Self-pay

## 2018-10-29 MED ORDER — ROSUVASTATIN CALCIUM 20 MG PO TABS: 20.0000 mg | ORAL_TABLET | Freq: Every day | ORAL | 3 refills | Status: DC

## 2018-10-29 NOTE — Telephone Encounter (Signed)
Error

## 2018-10-31 ENCOUNTER — Other Ambulatory Visit: Payer: Self-pay | Admitting: Internal Medicine

## 2018-11-24 ENCOUNTER — Emergency Department
Admission: EM | Admit: 2018-11-24 | Discharge: 2018-11-24 | Disposition: A | Discharge status: Home / Self Care | Payer: Medicare Other | Attending: Emergency Medicine | Admitting: Emergency Medicine

## 2018-11-24 ENCOUNTER — Encounter: Payer: Self-pay | Admitting: Emergency Medicine

## 2018-11-24 ENCOUNTER — Other Ambulatory Visit: Payer: Self-pay

## 2018-11-24 DIAGNOSIS — M791 Myalgia, unspecified site: Secondary | ICD-10-CM | POA: Diagnosis not present

## 2018-11-24 DIAGNOSIS — I10 Essential (primary) hypertension: Secondary | ICD-10-CM | POA: Diagnosis not present

## 2018-11-24 DIAGNOSIS — R110 Nausea: Secondary | ICD-10-CM | POA: Insufficient documentation

## 2018-11-24 DIAGNOSIS — E785 Hyperlipidemia, unspecified: Secondary | ICD-10-CM | POA: Diagnosis not present

## 2018-11-24 DIAGNOSIS — R509 Fever, unspecified: Secondary | ICD-10-CM | POA: Diagnosis not present

## 2018-11-24 DIAGNOSIS — J111 Influenza due to unidentified influenza virus with other respiratory manifestations: Secondary | ICD-10-CM

## 2018-11-24 DIAGNOSIS — R05 Cough: Secondary | ICD-10-CM | POA: Diagnosis not present

## 2018-11-24 DIAGNOSIS — Z87891 Personal history of nicotine dependence: Secondary | ICD-10-CM | POA: Diagnosis not present

## 2018-11-24 DIAGNOSIS — I2510 Atherosclerotic heart disease of native coronary artery without angina pectoris: Secondary | ICD-10-CM | POA: Insufficient documentation

## 2018-11-24 DIAGNOSIS — R69 Illness, unspecified: Secondary | ICD-10-CM

## 2018-11-24 MED ORDER — ONDANSETRON 4 MG PO TBDP
4.0000 mg | ORAL_TABLET | Freq: Once | ORAL | Status: AC
  Administered 2018-11-24: 4 mg via ORAL
  Filled 2018-11-24: qty 1

## 2018-11-24 MED ORDER — ONDANSETRON 4 MG PO TBDP: 4.0000 mg | ORAL_TABLET | Freq: Three times a day (TID) | ORAL | 0 refills | Status: DC | PRN

## 2018-11-24 MED ORDER — OSELTAMIVIR PHOSPHATE 75 MG PO CAPS: 75.0000 mg | ORAL_CAPSULE | Freq: Two times a day (BID) | ORAL | 0 refills | Status: AC

## 2018-11-24 NOTE — Discharge Instructions (Signed)
Follow-up with your primary care provider if any continued problems.  Begin taking Tylenol or ibuprofen as needed for body aches and/or fever and headache.  Tamiflu take 1 twice a day for the next 5 days.  This medication is for the flu.  Zofran is 1 every 8 hours if needed for nausea or vomiting.  Increase fluids.  Return to the emergency department if not improving or any worsening of your symptoms.

## 2018-11-24 NOTE — ED Provider Notes (Signed)
[facility] Emergency Department Provider Note  ____________________________________________   First MD Initiated Contact with Patient 11/24/18 [phone]     (approximate)  I have reviewed the triage vital signs and the nursing notes.   HISTORY  Chief Complaint Cough and Generalized Body Aches  HPI Carolyn Martin is a 68 y.o. female   presents to the ED with complaint of cough and body aches for last 2 days.  Patient states she took ibuprofen 200 mg at midnight.  Patient states that onset of symptoms was sudden and that she continues to have a body aches and fever.  She has decreased appetite but continues to drink fluids.  Currently she is slightly nauseous.  She denies any diarrhea.   Past Medical History:  Diagnosis Date  . Anginal pain ([place]) 05/14/2008   atypical chest pain-- Exercise  Myoview  EF 61% ,mod to severe ischemia Basal Anterior ,Anteroseptal,Mid Anterior,Mid Anteroseptal, Apical Anterior and Apical Septal regions   LV normal   . Coronary artery disease 06/23/2010   Echo EF =>55%,LV normal  . Hyperlipidemia   . Hypertension   . Sleep apnea   . Tremor, essential 02/19/2016    Patient Active Problem List   Diagnosis Date Noted  . Anxiety 07/18/2018  . Vitamin D insufficiency 07/18/2018  . Chronic kidney disease, stage II (mild) 05/31/2018  . Tremor, essential 02/19/2016  . Essential hypertension 01/21/2016  . Hyperlipidemia 01/21/2016  . Coronary artery disease due to lipid rich plaque 01/21/2016    Past Surgical History:  Procedure Laterality Date  . CARDIAC CATHETERIZATION  05/15/2008   2 areas of LAD  . CORONARY ANGIOPLASTY WITH STENT PLACEMENT  05/15/2008    proximal LAD -MiniVision stent 2.5 x 12 and mid LAD 2 overlapping MiniVision 2.0 x 15    Prior to Admission medications   Medication Sig Start Date End Date Taking? Authorizing Provider  Ascorbic Acid (VITAMIN C PO) Take 500 mg by mouth daily.     [provider]    B Complex Vitamins (B COMPLEX 100 PO) Take 100 mg by mouth daily.    [provider]  Cholecalciferol (VITAMIN D-3 PO) Take 5,000 Units by mouth daily.     [provider]  clonazePAM (KLONOPIN) 0.5 MG tablet Take 1 tablet (0.5 mg total) by mouth 2 (two) times daily as needed for anxiety. 07/18/18   Glendale Chard, MD  Coenzyme Q10 (CO Q 10 PO) Take 100 mg by mouth daily.     [provider]  ondansetron (ZOFRAN ODT) 4 MG disintegrating tablet Take 1 tablet (4 mg total) by mouth every 8 (eight) hours as needed for nausea or vomiting. 11/24/18   Johnn Hai, PA-C  oseltamivir (TAMIFLU) 75 MG capsule Take 1 capsule (75 mg total) by mouth 2 (two) times daily for 5 days. 11/24/18 11/29/18  Letitia Neri L, PA-C  rosuvastatin (CRESTOR) 10 MG tablet TAKE 1 TABLET BY MOUTH EVERY DAY 11/01/18   Glendale Chard, MD  rosuvastatin (CRESTOR) 20 MG tablet Take 1 tablet (20 mg total) by mouth daily. 10/29/18   Glendale Chard, MD    Allergies Iodine and Sulfa antibiotics  Family History  Problem Relation Age of Onset  . Diabetes Mother   . Hypertension Mother   . Transient ischemic attack Father   . Coronary artery disease Father   . Dementia Father     Social History Social History   Tobacco Use  . Smoking status: Former Smoker  Types: Cigarettes    Last attempt to quit: 12/17/2006    Years since quitting: 11.9  . Smokeless tobacco: Never Used  Substance Use Topics  . Alcohol use: No    Alcohol/week: 0.0 standard drinks    Comment: Occ 2 oz wine  . Drug use: No    Review of Systems Constitutional: Subjective fever/chills Eyes: No visual changes. ENT: No sore throat. Cardiovascular: Denies chest pain. Respiratory: Denies shortness of breath. Gastrointestinal: No abdominal pain.  Positive nausea, no vomiting.  No diarrhea.  No constipation. Genitourinary: Negative for dysuria. Musculoskeletal: Positive for body aches. Skin: Negative for rash. Neurological:  Negative for headaches, focal weakness or numbness. ____________________________________________   PHYSICAL EXAM:  VITAL SIGNS: ED Triage Vitals  Enc Vitals Group     BP 11/24/18 0824 139/76     Pulse Rate 11/24/18 0824 (!) 106     Resp 11/24/18 0824 20     Temp 11/24/18 0824 98.5 F (36.9 C)     Temp Source 11/24/18 0824 Oral     SpO2 11/24/18 0824 95 %     Weight 11/24/18 0825 132 lb (59.9 kg)     Height 11/24/18 0825 5' 2.5" (1.588 m)     Head Circumference --      Peak Flow --      Pain Score 11/24/18 0825 6     Pain Loc --      Pain Edu? --      Excl. in [place]? --    Constitutional: Alert and oriented. Well appearing and in no acute distress. Eyes: Conjunctivae are normal.  Head: Atraumatic. Nose: No congestion/rhinnorhea. Mouth/Throat: Mucous membranes are moist.  Oropharynx non-erythematous. Neck: No stridor.   Hematological/Lymphatic/Immunilogical: No cervical lymphadenopathy. Cardiovascular: Normal rate, regular rhythm. Grossly normal heart sounds.  Good peripheral circulation. Respiratory: Normal respiratory effort.  No retractions. Lungs CTAB. Gastrointestinal: Soft and nontender. No distention.  Musculoskeletal: Moves upper and lower extremities without any difficulty.  Normal gait was noted. Neurologic:  Normal speech and language. No gross focal neurologic deficits are appreciated. No gait instability. Skin:  Skin is warm, dry and intact. No rash noted. Psychiatric: Mood and affect are normal. Speech and behavior are normal.  ____________________________________________   LABS (all labs ordered are listed, but only abnormal results are displayed)  Labs Reviewed - No data to display   PROCEDURES  Procedure(s) performed: None  Procedures  Critical Care performed: No  ____________________________________________   INITIAL IMPRESSION / ASSESSMENT AND PLAN / ED COURSE  As part of my medical decision making, I reviewed the following data within the  electronic MEDICAL RECORD NUMBER Notes from prior ED visits and [place] Controlled Substance Database  Patient presents to the ED with complaint of 2 days of body aches, cough and subjective fever.  Patient currently complains of nausea but no vomiting or diarrhea.  Patient was given Zofran ODT while in the ED and states that this is helped with her symptoms tremendously.  She was also given a prescription for Tamiflu 75 mg twice daily for 5 days along with Zofran ODT as needed.  Patient is encouraged to drink fluids and take Tylenol or ibuprofen as needed for body aches, fever or headache.  She is to follow-up with her PCP or return to the emergency department if any severe worsening of her symptoms.  ____________________________________________   FINAL CLINICAL IMPRESSION(S) / ED DIAGNOSES  Final diagnoses:  Influenza-like illness     ED Discharge Orders  Ordered    oseltamivir (TAMIFLU) 75 MG capsule  2 times daily     11/24/18 1046    ondansetron (ZOFRAN ODT) 4 MG disintegrating tablet  Every 8 hours PRN     11/24/18 1046           Note:  This document was prepared using Dragon voice recognition software and may include unintentional dictation errors.    Johnn Hai, PA-C 11/24/18 6004    Delman Kitten, MD 11/25/18 1410

## 2018-11-24 NOTE — ED Triage Notes (Signed)
Cough and body aches x 3 days. Last dose ibuprofen 200mg  at midnight.

## 2018-11-28 ENCOUNTER — Encounter: Payer: Self-pay | Admitting: Internal Medicine

## 2018-11-28 ENCOUNTER — Other Ambulatory Visit: Payer: Self-pay

## 2018-11-28 ENCOUNTER — Ambulatory Visit (INDEPENDENT_AMBULATORY_CARE_PROVIDER_SITE_OTHER): Payer: Medicare Other | Admitting: Internal Medicine

## 2018-11-28 VITALS — BP 110/62 | HR 79 | Temp 98.9°F | Ht 63.2 in | Wt 129.8 lb

## 2018-11-28 DIAGNOSIS — R059 Cough, unspecified: Secondary | ICD-10-CM

## 2018-11-28 DIAGNOSIS — R05 Cough: Secondary | ICD-10-CM

## 2018-11-28 DIAGNOSIS — M791 Myalgia, unspecified site: Secondary | ICD-10-CM | POA: Diagnosis not present

## 2018-11-28 NOTE — Patient Instructions (Signed)

## 2018-11-28 NOTE — Progress Notes (Signed)
Subjective:     Patient ID: Carolyn Martin , female    DOB: 08-18-1951 , 68 y.o.   MRN: [id number]   Chief Complaint  Patient presents with  . Influenza    went to the er on saturday was not tested for the flu but was given tamiflu  . Leg Pain    aches constantly    HPI  Pt is here for FU possibly influenza. She was seen in ER last week and placed on Tamilflu. She states her cough is getting better, but what bother's her the most of lower leg pain bilaterally which hits at night time, specially at night time. It hurts her worse than when she had body aches with the flu. She admits she had been laying down a lot for 3 days. All her lower leg muscles ache and sides of her hips. Last night she took Ibuprofen which helped.    Past Medical History:  Diagnosis Date  . Anginal pain ([place]) 05/14/2008   atypical chest pain-- Exercise  Myoview  EF 61% ,mod to severe ischemia Basal Anterior ,Anteroseptal,Mid Anterior,Mid Anteroseptal, Apical Anterior and Apical Septal regions   LV normal   . Coronary artery disease 06/23/2010   Echo EF =>55%,LV normal  . Hyperlipidemia   . Hypertension   . Sleep apnea   . Tremor, essential 02/19/2016     Family History  Problem Relation Age of Onset  . Diabetes Mother   . Hypertension Mother   . Transient ischemic attack Father   . Coronary artery disease Father   . Dementia Father      Current Outpatient Medications:  .  Ascorbic Acid (VITAMIN C PO), Take 500 mg by mouth daily. , Disp: , Rfl:  .  B Complex Vitamins (B COMPLEX 100 PO), Take 100 mg by mouth daily., Disp: , Rfl:  .  Cholecalciferol (VITAMIN D-3 PO), Take 5,000 Units by mouth daily. , Disp: , Rfl:  .  clonazePAM (KLONOPIN) 0.5 MG tablet, Take 1 tablet (0.5 mg total) by mouth 2 (two) times daily as needed for anxiety., Disp: 30 tablet, Rfl: 3 .  Coenzyme Q10 (CO Q 10 PO), Take 100 mg by mouth daily. , Disp: , Rfl:  .  ondansetron (ZOFRAN ODT) 4 MG disintegrating tablet, Take 1 tablet (4  mg total) by mouth every 8 (eight) hours as needed for nausea or vomiting., Disp: 10 tablet, Rfl: 0 .  rosuvastatin (CRESTOR) 20 MG tablet, Take 1 tablet (20 mg total) by mouth daily., Disp: 90 tablet, Rfl: 3 .  oseltamivir (TAMIFLU) 75 MG capsule, Take 1 capsule (75 mg total) by mouth 2 (two) times daily for 5 days. (Patient not taking: Reported on 11/28/2018), Disp: 10 capsule, Rfl: 0 .  rosuvastatin (CRESTOR) 10 MG tablet, TAKE 1 TABLET BY MOUTH EVERY DAY (Patient not taking: Reported on 11/28/2018), Disp: 90 tablet, Rfl: 1   Allergies  Allergen Reactions  . Iodine Hives  . Sulfa Antibiotics      Review of Systems  Constitutional: Negative for chills, diaphoresis and fever.  HENT: Positive for postnasal drip and rhinorrhea. Negative for congestion, ear discharge, ear pain, sore throat, trouble swallowing and voice change.   Eyes: Negative for discharge.  Respiratory: Positive for cough. Negative for shortness of breath and wheezing.   Cardiovascular: Negative for chest pain, palpitations and leg swelling.  Gastrointestinal: Negative for nausea and vomiting.  Musculoskeletal: Positive for back pain and myalgias. Negative for arthralgias, gait problem and joint swelling.  Has chronic back pain and is flaring on her upper lumbar region. Lower leg myalgias as noted in HPI  Skin: Negative for rash.  Neurological: Negative for dizziness, weakness, numbness and headaches.  Hematological: Negative for adenopathy.     Today's Vitals   11/28/18 1021  BP: 110/62  Pulse: 79  Temp: 98.9 F (37.2 C)  TempSrc: Oral  SpO2: 96%  Weight: 129 lb 12.8 oz (58.9 kg)  Height: 5' 3.2" (1.605 m)   Body mass index is 22.85 kg/m.   Objective:  Physical Exam Vitals signs and nursing note reviewed.  Constitutional:      General: She is not in acute distress.    Appearance: Normal appearance. She is not toxic-appearing.  HENT:     Head: Normocephalic.     Right Ear: Tympanic membrane, ear  canal and external ear normal.     Left Ear: Tympanic membrane, ear canal and external ear normal.     Nose: Nose normal.     Mouth/Throat:     Pharynx: Oropharynx is clear.  Eyes:     General: No scleral icterus.    Conjunctiva/sclera: Conjunctivae normal.  Neck:     Musculoskeletal: Neck supple. No neck rigidity.  Cardiovascular:     Rate and Rhythm: Normal rate and regular rhythm.     Pulses: Normal pulses.     Heart sounds: No murmur.  Pulmonary:     Effort: Pulmonary effort is normal.     Breath sounds: Normal breath sounds. No wheezing, rhonchi or rales.  Musculoskeletal: Normal range of motion.        General: Tenderness present.     Right lower leg: No edema.     Left lower leg: No edema.     Comments: Has diffuse muscle tenderness on thighs and calf. There is no asymmetry noted, no calf cords or calf tenderness.   Lymphadenopathy:     Cervical: No cervical adenopathy.  Skin:    General: Skin is warm and dry.     Findings: No rash.  Neurological:     Mental Status: She is alert and oriented to person, place, and time.     Gait: Gait normal.  Psychiatric:        Mood and Affect: Mood normal.        Behavior: Behavior normal.        Thought Content: Thought content normal.        Judgment: Judgment normal.    REPEATED PULSE OX= 98% Assessment And Plan:    1. Myalgia- of lower legs bilaterally. Could be side effect from Tamiflu which says as 5th common adverse effect " pain" - CK, total   D-Dimer   - CMP14 + Anion Gap - Magnesium 2. Cough- improving. She was advised to finish the last 2 doses of the Tamiflu. I will call her when the results are back.   I will call her when the results come back.    Alfa Leibensperger RODRIGUEZ-SOUTHWORTH, PA-C

## 2018-11-29 LAB — CMP14 + ANION GAP
ALBUMIN: 4.4 g/dL (ref 3.8–4.8)
ALK PHOS: 75 IU/L (ref 39–117)
ALT: 19 IU/L (ref 0–32)
ANION GAP: 16 mmol/L (ref 10.0–18.0)
AST: 23 IU/L (ref 0–40)
Albumin/Globulin Ratio: 1.8 (ref 1.2–2.2)
BUN / CREAT RATIO: 19 (ref 12–28)
BUN: 16 mg/dL (ref 8–27)
Bilirubin Total: 0.5 mg/dL (ref 0.0–1.2)
CALCIUM: 9.4 mg/dL (ref 8.7–10.3)
CO2: 24 mmol/L (ref 20–29)
CREATININE: 0.84 mg/dL (ref 0.57–1.00)
Chloride: 103 mmol/L (ref 96–106)
GFR calc Af Amer: 84 mL/min/{1.73_m2} (ref >59)
GFR, EST NON AFRICAN AMERICAN: 73 mL/min/{1.73_m2} (ref >59)
GLOBULIN, TOTAL: 2.4 g/dL (ref 1.5–4.5)
Glucose: 93 mg/dL (ref 65–99)
Potassium: 4.9 mmol/L (ref 3.5–5.2)
SODIUM: 143 mmol/L (ref 134–144)
Total Protein: 6.8 g/dL (ref 6.0–8.5)

## 2018-11-29 LAB — D-DIMER, QUANTITATIVE: D-DIMER: 0.34 mg/L FEU (ref 0.00–0.49)

## 2018-11-29 LAB — MAGNESIUM: Magnesium: 2.7 mg/dL — ABNORMAL HIGH (ref 1.6–2.3)

## 2018-11-29 LAB — CK: Total CK: 81 U/L (ref 24–173)

## 2018-12-26 ENCOUNTER — Ambulatory Visit: Payer: Medicare Other | Admitting: Internal Medicine

## 2019-01-16 ENCOUNTER — Encounter: Payer: Self-pay | Admitting: Internal Medicine

## 2019-01-16 ENCOUNTER — Ambulatory Visit (INDEPENDENT_AMBULATORY_CARE_PROVIDER_SITE_OTHER): Payer: Medicare Other | Admitting: Internal Medicine

## 2019-01-16 ENCOUNTER — Other Ambulatory Visit: Payer: Self-pay

## 2019-01-16 VITALS — BP 120/78 | HR 78 | Temp 97.7°F | Ht 62.0 in | Wt 130.6 lb

## 2019-01-16 DIAGNOSIS — I2510 Atherosclerotic heart disease of native coronary artery without angina pectoris: Secondary | ICD-10-CM | POA: Diagnosis not present

## 2019-01-16 DIAGNOSIS — G250 Essential tremor: Secondary | ICD-10-CM

## 2019-01-16 DIAGNOSIS — E7800 Pure hypercholesterolemia, unspecified: Secondary | ICD-10-CM

## 2019-01-16 DIAGNOSIS — I2583 Coronary atherosclerosis due to lipid rich plaque: Secondary | ICD-10-CM | POA: Diagnosis not present

## 2019-01-16 DIAGNOSIS — Z860100 Personal history of colon polyps, unspecified: Secondary | ICD-10-CM

## 2019-01-16 NOTE — Progress Notes (Signed)
Subjective:     Patient ID: Carolyn Martin , female    DOB: May 03, 1951 , 68 y.o.   MRN: [id number]   Chief Complaint  Patient presents with  . Hyperlipidemia    HPI  She is here today for a cholesterol check. She reports compliance with Crestor. This was recently increased by her cardiologist. She has yet to have f/u labs drawn. She has yet to have any issues with the higher dose.   Hyperlipidemia  This is a chronic problem. The problem is controlled. Risk factors for coronary artery disease include dyslipidemia and post-menopausal.   She has h/o CAD. She is s/p stent placement   Past Medical History:  Diagnosis Date  . Anginal pain ([place]) 05/14/2008   atypical chest pain-- Exercise  Myoview  EF 61% ,mod to severe ischemia Basal Anterior ,Anteroseptal,Mid Anterior,Mid Anteroseptal, Apical Anterior and Apical Septal regions   LV normal   . Coronary artery disease 06/23/2010   Echo EF =>55%,LV normal  . Hyperlipidemia   . Hypertension   . Sleep apnea   . Tremor, essential 02/19/2016     Family History  Problem Relation Age of Onset  . Diabetes Mother   . Hypertension Mother   . Transient ischemic attack Father   . Coronary artery disease Father   . Dementia Father      Current Outpatient Medications:  .  Ascorbic Acid (VITAMIN C PO), Take 500 mg by mouth daily. , Disp: , Rfl:  .  B Complex Vitamins (B COMPLEX 100 PO), Take 100 mg by mouth daily., Disp: , Rfl:  .  Cholecalciferol (VITAMIN D-3 PO), Take 5,000 Units by mouth daily. , Disp: , Rfl:  .  clonazePAM (KLONOPIN) 0.5 MG tablet, Take 1 tablet (0.5 mg total) by mouth 2 (two) times daily as needed for anxiety., Disp: 30 tablet, Rfl: 3 .  Coenzyme Q10 (CO Q 10 PO), Take 100 mg by mouth daily. , Disp: , Rfl:  .  rosuvastatin (CRESTOR) 20 MG tablet, Take 1 tablet (20 mg total) by mouth daily., Disp: 90 tablet, Rfl: 3 .  ondansetron (ZOFRAN ODT) 4 MG disintegrating tablet, Take 1 tablet (4 mg total) by mouth every 8 (eight)  hours as needed for nausea or vomiting. (Patient not taking: Reported on 01/16/2019), Disp: 10 tablet, Rfl: 0 .  rosuvastatin (CRESTOR) 10 MG tablet, TAKE 1 TABLET BY MOUTH EVERY DAY (Patient not taking: Reported on 11/28/2018), Disp: 90 tablet, Rfl: 1   Allergies  Allergen Reactions  . Iodine Hives  . Sulfa Antibiotics      Review of Systems  Constitutional: Negative.   Respiratory: Negative.   Cardiovascular: Negative.   Gastrointestinal: Negative.   Neurological: Negative.   Psychiatric/Behavioral: Negative.      Today's Vitals   01/16/19 0820  BP: 120/78  Pulse: 78  Temp: 97.7 F (36.5 C)  TempSrc: Oral  SpO2: 98%  Weight: 130 lb 9.6 oz (59.2 kg)  Height: 5\' 2"  (1.575 m)   Body mass index is 23.89 kg/m.   Objective:  Physical Exam Vitals signs and nursing note reviewed.  Constitutional:      Appearance: Normal appearance.  HENT:     Head: Normocephalic and atraumatic.  Cardiovascular:     Rate and Rhythm: Normal rate and regular rhythm.     Heart sounds: Normal heart sounds.  Pulmonary:     Effort: Pulmonary effort is normal.     Breath sounds: Normal breath sounds.  Skin:    General:  Skin is warm.  Neurological:     General: No focal deficit present.     Mental Status: She is alert.  Psychiatric:        Mood and Affect: Mood normal.        Behavior: Behavior normal.         Assessment And Plan:     1. Pure hypercholesterolemia  I will check a fasting lipid panel today. She will rto in Sept 2020 for her next AWV. She is encouraged to resume her regular walking plan.  2. Coronary artery disease due to lipid rich plaque  Chronic, yet stable.  She is also followed by Cardiology. Importance of compliance with statin therapy was stressed to the patient.   3. Tremor, essential  Chronic, yet stable.   4. Personal history of colonic polyps  I will check [employer] for previous colonoscopy.  I am able to get on now since servers are down. She has family  h/o rectal cancer as well.  [name], MD

## 2019-01-16 NOTE — Patient Instructions (Signed)

## 2019-01-17 ENCOUNTER — Telehealth: Payer: Self-pay

## 2019-01-17 ENCOUNTER — Ambulatory Visit: Payer: Medicare Other | Admitting: Internal Medicine

## 2019-01-17 LAB — HEPATIC FUNCTION PANEL
ALT: 24 IU/L (ref 0–32)
AST: 24 IU/L (ref 0–40)
Albumin: 4.6 g/dL (ref 3.8–4.8)
Alkaline Phosphatase: 78 IU/L (ref 39–117)
Bilirubin Total: 0.5 mg/dL (ref 0.0–1.2)
Bilirubin, Direct: 0.12 mg/dL (ref 0.00–0.40)
Total Protein: 6.7 g/dL (ref 6.0–8.5)

## 2019-01-17 LAB — LIPID PANEL
Chol/HDL Ratio: 2.7 ratio (ref 0.0–4.4)
Cholesterol, Total: 159 mg/dL (ref 100–199)
HDL: 58 mg/dL (ref >39)
LDL Calculated: 76 mg/dL (ref 0–99)
Triglycerides: 123 mg/dL (ref 0–149)
VLDL Cholesterol Cal: 25 mg/dL (ref 5–40)

## 2019-01-17 NOTE — Telephone Encounter (Signed)
-----   Message from Glendale Chard, MD sent at 01/17/2019 12:33 PM EDT ----- Your liver fxn is nl. Your chol is great. Continue with current meds.

## 2019-01-17 NOTE — Telephone Encounter (Signed)
Patient notified of lab results

## 2019-03-04 ENCOUNTER — Ambulatory Visit: Payer: Self-pay

## 2019-03-04 DIAGNOSIS — I2510 Atherosclerotic heart disease of native coronary artery without angina pectoris: Secondary | ICD-10-CM

## 2019-03-04 DIAGNOSIS — I2583 Coronary atherosclerosis due to lipid rich plaque: Secondary | ICD-10-CM

## 2019-03-04 DIAGNOSIS — E559 Vitamin D deficiency, unspecified: Secondary | ICD-10-CM

## 2019-03-04 DIAGNOSIS — I10 Essential (primary) hypertension: Secondary | ICD-10-CM

## 2019-03-04 DIAGNOSIS — N182 Chronic kidney disease, stage 2 (mild): Secondary | ICD-10-CM

## 2019-03-04 NOTE — Chronic Care Management (AMB) (Signed)
  Chronic Care Management   Outreach Note  03/04/2019 Name: NORENE OLIVERI MRN: [id number] DOB: 1951-08-19  Referred by: patients health plan Reason for referral : Care Coordination   An unsuccessful telephone outreach was attempted today. The patient was referred to the case management team by for assistance with chronic care management and care coordination.   Follow Up Plan: A HIPPA compliant phone message was left for the patient providing contact information and requesting a return call.  The CM team will reach out to the patient again over the next 7-14 days.   Daneen Schick, BSW, CDP [facility] Care Management Social Worker [phone]  Total time spent performing care coordination and/or care management activities with the patient by phone or face to face = 3 minutes.

## 2019-03-05 ENCOUNTER — Ambulatory Visit: Payer: Self-pay

## 2019-03-05 DIAGNOSIS — I10 Essential (primary) hypertension: Secondary | ICD-10-CM

## 2019-03-05 DIAGNOSIS — E559 Vitamin D deficiency, unspecified: Secondary | ICD-10-CM

## 2019-03-05 DIAGNOSIS — I2583 Coronary atherosclerosis due to lipid rich plaque: Secondary | ICD-10-CM

## 2019-03-05 DIAGNOSIS — N182 Chronic kidney disease, stage 2 (mild): Secondary | ICD-10-CM

## 2019-03-05 DIAGNOSIS — I2510 Atherosclerotic heart disease of native coronary artery without angina pectoris: Secondary | ICD-10-CM

## 2019-03-05 NOTE — Chronic Care Management (AMB) (Signed)
  Chronic Care Management   Telephone Outreach Note  03/05/2019 Name: Carolyn Martin MRN: [id number] DOB: [birth year]  Referred by: patient's health plan.   I received an inbound call from Carolyn Martin today by phone in response to a recent voice message left by CCM SW regarding patients referral to the program by her health plan.  Carolyn Martin was given information about Chronic Care Management services today including:  1. CCM service includes personalized support from designated clinical staff supervised by her physician, including individualized plan of care and coordination with other care providers 2. 24/7 contact phone numbers for assistance for urgent and routine care needs. 3. Service will only be billed when office clinical staff spend 20 minutes or more in a month to coordinate care. 4. Only one practitioner may furnish and bill the service in a calendar month. 5. The patient may stop CCM services at any time (effective at the end of the month) by phone call to the office staff. 6. The patient will be responsible for cost sharing (co-pay) of up to 20% of the service fee (after annual deductible is met).  Patient did not agree to services and does not wish to consider at this time.  No further follow up required: The patient verbalizes understanding of CCM program and understands she may enroll at any time.   Glendale Chard, MD has been notified of this outreach and Carolyn Martin decision and plan.   Daneen Schick, BSW, CDP [facility] Care Management Social Worker [phone]  Total time spent performing care coordination and/or care management activities with the patient by phone or face to face =6 minutes.

## 2019-06-04 ENCOUNTER — Other Ambulatory Visit: Payer: Self-pay | Admitting: Internal Medicine

## 2019-06-04 DIAGNOSIS — Z1231 Encounter for screening mammogram for malignant neoplasm of breast: Secondary | ICD-10-CM

## 2019-06-11 ENCOUNTER — Other Ambulatory Visit: Payer: Self-pay | Admitting: Internal Medicine

## 2019-07-10 ENCOUNTER — Ambulatory Visit (INDEPENDENT_AMBULATORY_CARE_PROVIDER_SITE_OTHER): Payer: Medicare Other

## 2019-07-10 ENCOUNTER — Ambulatory Visit (INDEPENDENT_AMBULATORY_CARE_PROVIDER_SITE_OTHER): Payer: Medicare Other | Admitting: Internal Medicine

## 2019-07-10 ENCOUNTER — Other Ambulatory Visit: Payer: Self-pay

## 2019-07-10 ENCOUNTER — Encounter: Payer: Self-pay | Admitting: Internal Medicine

## 2019-07-10 VITALS — BP 110/64 | HR 73 | Temp 98.2°F | Ht 62.8 in | Wt 133.4 lb

## 2019-07-10 DIAGNOSIS — I10 Essential (primary) hypertension: Secondary | ICD-10-CM

## 2019-07-10 DIAGNOSIS — Z0000 Encounter for general adult medical examination without abnormal findings: Secondary | ICD-10-CM

## 2019-07-10 DIAGNOSIS — G250 Essential tremor: Secondary | ICD-10-CM

## 2019-07-10 DIAGNOSIS — I2510 Atherosclerotic heart disease of native coronary artery without angina pectoris: Secondary | ICD-10-CM | POA: Diagnosis not present

## 2019-07-10 DIAGNOSIS — I119 Hypertensive heart disease without heart failure: Secondary | ICD-10-CM

## 2019-07-10 DIAGNOSIS — I2583 Coronary atherosclerosis due to lipid rich plaque: Secondary | ICD-10-CM

## 2019-07-10 DIAGNOSIS — R7309 Other abnormal glucose: Secondary | ICD-10-CM

## 2019-07-10 DIAGNOSIS — E7800 Pure hypercholesterolemia, unspecified: Secondary | ICD-10-CM

## 2019-07-10 DIAGNOSIS — Z23 Encounter for immunization: Secondary | ICD-10-CM

## 2019-07-10 LAB — POCT UA - MICROALBUMIN
Albumin/Creatinine Ratio, Urine, POC: <30
Creatinine, POC: 300 mg/dL
Microalbumin Ur, POC: 10 mg/L

## 2019-07-10 LAB — POCT URINALYSIS DIPSTICK
Bilirubin, UA: NEGATIVE
Glucose, UA: NEGATIVE
Ketones, UA: NEGATIVE
Nitrite, UA: NEGATIVE
Protein, UA: NEGATIVE
Spec Grav, UA: >=1.03 — AB (ref 1.010–1.025)
Urobilinogen, UA: 0.2 E.U./dL
pH, UA: 6 (ref 5.0–8.0)

## 2019-07-10 MED ORDER — CLONAZEPAM 0.5 MG PO TABS: 0.5000 mg | ORAL_TABLET | Freq: Every day | ORAL | 1 refills | Status: DC | PRN

## 2019-07-10 MED ORDER — PNEUMOCOCCAL 13-VAL CONJ VACC IM SUSP: 0.5000 mL | INTRAMUSCULAR | 0 refills | Status: AC

## 2019-07-10 NOTE — Progress Notes (Signed)
Subjective:     Patient ID: Carolyn Martin , female    DOB: [birth year] , 68 y.o.   MRN: [id number]   Chief Complaint  Patient presents with  . Hyperlipidemia  . Annual Exam    HPI  She is here today for chol check. She reports compliance with meds.  She thought she was also getting physical today.     Past Medical History:  Diagnosis Date  . Anginal pain ([place]) 05/14/2008   atypical chest pain-- Exercise  Myoview  EF 61% ,mod to severe ischemia Basal Anterior ,Anteroseptal,Mid Anterior,Mid Anteroseptal, Apical Anterior and Apical Septal regions   LV normal   . Coronary artery disease 06/23/2010   Echo EF =>55%,LV normal  . Hyperlipidemia   . Hypertension   . Sleep apnea   . Tremor, essential 02/19/2016     Family History  Problem Relation Age of Onset  . Diabetes Mother   . Hypertension Mother   . Transient ischemic attack Father   . Coronary artery disease Father   . Dementia Father      Current Outpatient Medications:  .  Ascorbic Acid (VITAMIN C PO), Take 500 mg by mouth daily. , Disp: , Rfl:  .  B Complex Vitamins (B COMPLEX 100 PO), Take 100 mg by mouth daily., Disp: , Rfl:  .  Cholecalciferol (VITAMIN D-3 PO), Take 5,000 Units by mouth daily. , Disp: , Rfl:  .  clonazePAM (KLONOPIN) 0.5 MG tablet, Take 1 tablet (0.5 mg total) by mouth daily as needed for anxiety., Disp: 30 tablet, Rfl: 1 .  Coenzyme Q10 (CO Q 10 PO), Take 100 mg by mouth daily. , Disp: , Rfl:  .  ondansetron (ZOFRAN ODT) 4 MG disintegrating tablet, Take 1 tablet (4 mg total) by mouth every 8 (eight) hours as needed for nausea or vomiting. (Patient not taking: Reported on 01/16/2019), Disp: 10 tablet, Rfl: 0 .  rosuvastatin (CRESTOR) 10 MG tablet, TAKE 1 TABLET BY MOUTH EVERY DAY, Disp: 90 tablet, Rfl: 1   Allergies  Allergen Reactions  . Iodine Hives  . Sulfa Antibiotics      The patient states she uses post menopausal status for birth control. Last LMP was No LMP recorded. Patient is  postmenopausal.. Negative for Dysmenorrhea '@MAMMOFINDINGS' @. Negative for: breast discharge, breast lump(s), breast pain and breast self exam. Associated symptoms include abnormal vaginal bleeding. Pertinent negatives include abnormal bleeding (hematology), anxiety, decreased libido, depression, difficulty falling sleep, dyspareunia, history of infertility, nocturia, sexual dysfunction, sleep disturbances, urinary incontinence, urinary urgency, vaginal discharge and vaginal itching. Diet regular.The patient states her exercise level is  intermittent.   . The patient's tobacco use is:  Social History   Tobacco Use  Smoking Status Former Smoker  . Types: Cigarettes  . Quit date: 12/17/2006  . Years since quitting: 12.5  Smokeless Tobacco Never Used  . She has been exposed to passive smoke. The patient's alcohol use is:  Social History   Substance and Sexual Activity  Alcohol Use No  . Alcohol/week: 0.0 standard drinks     Review of Systems  Constitutional: Negative.   HENT: Negative.   Eyes: Negative.   Respiratory: Negative.   Cardiovascular: Negative.   Endocrine: Negative.   Genitourinary: Negative.   Musculoskeletal: Negative.   Skin: Negative.   Allergic/Immunologic: Negative.   Neurological: Negative.   Hematological: Negative.   Psychiatric/Behavioral: Negative.      Today's Vitals   07/10/19 0904  BP: 110/64  Pulse: 73  Temp: 98.2  F (36.8 C)  TempSrc: Oral  SpO2: 98%  Weight: 133 lb 6.4 oz (60.5 kg)  Height: 5' 2.8" (1.595 m)   Body mass index is 23.78 kg/m.   Objective:  Physical Exam Vitals signs and nursing note reviewed.  Constitutional:      Appearance: Normal appearance.  HENT:     Head: Normocephalic and atraumatic.     Right Ear: Tympanic membrane, ear canal and external ear normal.     Left Ear: Tympanic membrane, ear canal and external ear normal.     Nose: Nose normal.     Mouth/Throat:     Mouth: Mucous membranes are moist.     Pharynx:  Oropharynx is clear.  Eyes:     Extraocular Movements: Extraocular movements intact.     Conjunctiva/sclera: Conjunctivae normal.     Pupils: Pupils are equal, round, and reactive to light.  Neck:     Musculoskeletal: Normal range of motion and neck supple.  Cardiovascular:     Rate and Rhythm: Normal rate and regular rhythm.     Pulses: Normal pulses.     Heart sounds: Normal heart sounds.  Pulmonary:     Effort: Pulmonary effort is normal.     Breath sounds: Normal breath sounds.  Abdominal:     General: Abdomen is flat. Bowel sounds are normal.     Palpations: Abdomen is soft.  Genitourinary:    Comments: deferred Musculoskeletal: Normal range of motion.  Skin:    General: Skin is warm and dry.  Neurological:     General: No focal deficit present.     Mental Status: She is alert and oriented to person, place, and time.     Motor: Tremor present.  Psychiatric:        Mood and Affect: Mood normal.        Behavior: Behavior normal.         Assessment And Plan:     1. Benign hypertensive heart disease without heart failure  Resolved. She is no longer on medication. She is encouraged to follow a low salt diet. EKG performed, no new changes noted. She will rto in six months for re-evaluation.   - CMP14+EGFR - CBC - EKG 12-Lead   2. Coronary artery disease due to lipid rich plaque  Chronic, yet stable. She is s/p stent placement. She is without cp, palpitations and shortness of breath. She is also followed by Cardiology.   3. Pure hypercholesterolemia  Chronic, she reports compliance with rosuvastatin. I will check fasting lipid panel and LFTs.  She is encouraged to avoid fried foods and increase daily activity.   - Lipid panel  4. Routine general medical examination at health care facility  A full exam was performed.  Importance of monthly self breast exams was discussed with the aptient. PATIENT HAS BEEN ADVISED TO GET 30-45 MINUTES REGULAR EXERCISE NO LESS THAN  FOUR TO FIVE DAYS PER WEEK - BOTH WEIGHTBEARING EXERCISES AND AEROBIC ARE RECOMMENDED.  SHE WAS ADVISED TO FOLLOW A HEALTHY DIET WITH AT LEAST SIX FRUITS/VEGGIES PER DAY, DECREASE INTAKE OF RED MEAT, AND TO INCREASE FISH INTAKE TO TWO DAYS PER WEEK.  MEATS/FISH SHOULD NOT BE FRIED, BAKED OR BROILED IS PREFERABLE.  I SUGGEST WEARING SPF 50 SUNSCREEN ON EXPOSED PARTS AND ESPECIALLY WHEN IN THE DIRECT SUNLIGHT FOR AN EXTENDED PERIOD OF TIME.  PLEASE AVOID FAST FOOD RESTAURANTS AND INCREASE YOUR WATER INTAKE.   5. Other abnormal glucose  HER A1C HAS BEEN ELEVATED IN THE  PAST. I WILL CHECK AN A1C, BMET TODAY. SHE WAS ENCOURAGED TO AVOID SUGARY BEVERAGES AND PROCESSED FOODS INCLUDNG BREADS, RICE AND PASTA.  - Hemoglobin A1c  6. Need for vaccination  She was given high dose flu vaccine.   7. Tremor, essential  Chronic. She was given refill of clonazepam to use prn. Review of the [place] CSRS was performed in accordance of the [place] prior to dispensing any controlled drugs.     [name], MD    THE PATIENT IS ENCOURAGED TO PRACTICE SOCIAL DISTANCING DUE TO THE COVID-19 PANDEMIC.

## 2019-07-10 NOTE — Patient Instructions (Signed)
Health Maintenance, Female Adopting a healthy lifestyle and getting preventive care are important in promoting health and wellness. Ask your health care provider about:  The right schedule for you to have regular tests and exams.  Things you can do on your own to prevent diseases and keep yourself healthy. What should I know about diet, weight, and exercise? Eat a healthy diet   Eat a diet that includes plenty of vegetables, fruits, low-fat dairy products, and lean protein.  Do not eat a lot of foods that are high in solid fats, added sugars, or sodium. Maintain a healthy weight Body mass index (BMI) is used to identify weight problems. It estimates body fat based on height and weight. Your health care provider can help determine your BMI and help you achieve or maintain a healthy weight. Get regular exercise Get regular exercise. This is one of the most important things you can do for your health. Most adults should:  Exercise for at least 150 minutes each week. The exercise should increase your heart rate and make you sweat (moderate-intensity exercise).  Do strengthening exercises at least twice a week. This is in addition to the moderate-intensity exercise.  Spend less time sitting. Even light physical activity can be beneficial. Watch cholesterol and blood lipids Have your blood tested for lipids and cholesterol at 67 years of age, then have this test every 5 years. Have your cholesterol levels checked more often if:  Your lipid or cholesterol levels are high.  You are older than 67 years of age.  You are at high risk for heart disease. What should I know about cancer screening? Depending on your health history and family history, you may need to have cancer screening at various ages. This may include screening for:  Breast cancer.  Cervical cancer.  Colorectal cancer.  Skin cancer.  Lung cancer. What should I know about heart disease, diabetes, and high blood  pressure? Blood pressure and heart disease  High blood pressure causes heart disease and increases the risk of stroke. This is more likely to develop in people who have high blood pressure readings, are of African descent, or are overweight.  Have your blood pressure checked: ? Every 3-5 years if you are 18-39 years of age. ? Every year if you are 40 years old or older. Diabetes Have regular diabetes screenings. This checks your fasting blood sugar level. Have the screening done:  Once every three years after age 40 if you are at a normal weight and have a low risk for diabetes.  More often and at a younger age if you are overweight or have a high risk for diabetes. What should I know about preventing infection? Hepatitis B If you have a higher risk for hepatitis B, you should be screened for this virus. Talk with your health care provider to find out if you are at risk for hepatitis B infection. Hepatitis C Testing is recommended for:  Everyone born from 1945 through 1965.  Anyone with known risk factors for hepatitis C. Sexually transmitted infections (STIs)  Get screened for STIs, including gonorrhea and chlamydia, if: ? You are sexually active and are younger than 67 years of age. ? You are older than 67 years of age and your health care provider tells you that you are at risk for this type of infection. ? Your sexual activity has changed since you were last screened, and you are at increased risk for chlamydia or gonorrhea. Ask your health care provider if   you are at risk.  Ask your health care provider about whether you are at high risk for HIV. Your health care provider may recommend a prescription medicine to help prevent HIV infection. If you choose to take medicine to prevent HIV, you should first get tested for HIV. You should then be tested every 3 months for as long as you are taking the medicine. Pregnancy  If you are about to stop having your period (premenopausal) and  you may become pregnant, seek counseling before you get pregnant.  Take 400 to 800 micrograms (mcg) of folic acid every day if you become pregnant.  Ask for birth control (contraception) if you want to prevent pregnancy. Osteoporosis and menopause Osteoporosis is a disease in which the bones lose minerals and strength with aging. This can result in bone fractures. If you are 65 years old or older, or if you are at risk for osteoporosis and fractures, ask your health care provider if you should:  Be screened for bone loss.  Take a calcium or vitamin D supplement to lower your risk of fractures.  Be given hormone replacement therapy (HRT) to treat symptoms of menopause. Follow these instructions at home: Lifestyle  Do not use any products that contain nicotine or tobacco, such as cigarettes, e-cigarettes, and chewing tobacco. If you need help quitting, ask your health care provider.  Do not use street drugs.  Do not share needles.  Ask your health care provider for help if you need support or information about quitting drugs. Alcohol use  Do not drink alcohol if: ? Your health care provider tells you not to drink. ? You are pregnant, may be pregnant, or are planning to become pregnant.  If you drink alcohol: ? Limit how much you use to 0-1 drink a day. ? Limit intake if you are breastfeeding.  Be aware of how much alcohol is in your drink. In the [place] one drink equals one 12 oz bottle of beer (355 mL), one 5 oz glass of wine (148 mL), or one 1 oz glass of hard liquor (44 mL). General instructions  Schedule regular health, dental, and eye exams.  Stay current with your vaccines.  Tell your health care provider if: ? You often feel depressed. ? You have ever been abused or do not feel safe at home. Summary  Adopting a healthy lifestyle and getting preventive care are important in promoting health and wellness.  Follow your health care provider's instructions about healthy  diet, exercising, and getting tested or screened for diseases.  Follow your health care provider's instructions on monitoring your cholesterol and blood pressure. This information is not intended to replace advice given to you by your health care provider. Make sure you discuss any questions you have with your health care provider. Document Released: 04/18/2011 Document Revised: 09/26/2018 Document Reviewed: 09/26/2018 Elsevier Patient Education  2020 Elsevier Inc.  

## 2019-07-10 NOTE — Addendum Note (Signed)
Addended by: Glenna Durand E on: 07/10/2019 10:30 AM   Modules accepted: Orders

## 2019-07-10 NOTE — Progress Notes (Signed)
Subjective:   Carolyn Martin is a 68 y.o. female who presents for Medicare Annual (Subsequent) preventive examination.  Review of Systems:  n/a Cardiac Risk Factors include: advanced age (>24men, >2 women);dyslipidemia;hypertension     Objective:     Vitals: BP 110/64 (BP Location: Left Arm, Patient Position: Sitting, Cuff Size: Normal)   Pulse 73   Temp 98.2 F (36.8 C) (Oral)   Ht 5' 2.8" (1.595 m)   Wt 133 lb 6.4 oz (60.5 kg)   SpO2 98%   BMI 23.78 kg/m   Body mass index is 23.78 kg/m.  Advanced Directives 07/10/2019 11/24/2018 02/24/2018  Does Patient Have a Medical Advance Directive? Yes No No  Type of Paramedic of [place];Living will - -  Copy of [place] in Chart? No - copy requested - -    Tobacco Social History   Tobacco Use  Smoking Status Former Smoker  . Types: Cigarettes  . Quit date: 12/17/2006  . Years since quitting: 12.5  Smokeless Tobacco Never Used     Counseling given: Not Answered   Clinical Intake:  Pre-visit preparation completed: Yes  Pain : No/denies pain     Nutritional Status: BMI of 19-24  Normal Nutritional Risks: None Diabetes: No  How often do you need to have someone help you when you read instructions, pamphlets, or other written materials from your doctor or pharmacy?: 1 - Never What is the last grade level you completed in school?: 12th grade  Interpreter Needed?: No  Information entered by :: NAllen LPN  Past Medical History:  Diagnosis Date  . Anginal pain ([place]) 05/14/2008   atypical chest pain-- Exercise  Myoview  EF 61% ,mod to severe ischemia Basal Anterior ,Anteroseptal,Mid Anterior,Mid Anteroseptal, Apical Anterior and Apical Septal regions   LV normal   . Coronary artery disease 06/23/2010   Echo EF =>55%,LV normal  . Hyperlipidemia   . Hypertension   . Sleep apnea   . Tremor, essential 02/19/2016   Past Surgical History:  Procedure Laterality Date  .  CARDIAC CATHETERIZATION  05/15/2008   2 areas of LAD  . CORONARY ANGIOPLASTY WITH STENT PLACEMENT  05/15/2008    proximal LAD -MiniVision stent 2.5 x 12 and mid LAD 2 overlapping MiniVision 2.0 x 15   Family History  Problem Relation Age of Onset  . Diabetes Mother   . Hypertension Mother   . Transient ischemic attack Father   . Coronary artery disease Father   . Dementia Father    Social History   Socioeconomic History  . Marital status: Married    Spouse name: Hendricks Milo  . Number of children: 2  . Years of education: 79  . Highest education level: Not on file  Occupational History  . Occupation: Retired    Fish farm manager: [employer]  Social Needs  . Financial resource strain: Not hard at all  . Food insecurity    Worry: Never true    Inability: Never true  . Transportation needs    Medical: No    Non-medical: No  Tobacco Use  . Smoking status: Former Smoker    Types: Cigarettes    Quit date: 12/17/2006    Years since quitting: 12.5  . Smokeless tobacco: Never Used  Substance and Sexual Activity  . Alcohol use: No    Alcohol/week: 0.0 standard drinks  . Drug use: No  . Sexual activity: Yes  Lifestyle  . Physical activity    Days per week:  5 days    Minutes per session: 30 min  . Stress: Not at all  Relationships  . Social Herbalist on phone: Not on file    Gets together: Not on file    Attends religious service: Not on file    Active member of club or organization: Not on file    Attends meetings of clubs or organizations: Not on file    Relationship status: Not on file  Other Topics Concern  . Not on file  Social History Narrative   Lives w/ husband   Right-handed   Drinks about 12 oz caffeinated beverage per day    Outpatient Encounter Medications as of 07/10/2019  Medication Sig  . Ascorbic Acid (VITAMIN C PO) Take 500 mg by mouth daily.   . B Complex Vitamins (B COMPLEX 100 PO) Take 100 mg by mouth daily.  . Cholecalciferol (VITAMIN D-3 PO)  Take 5,000 Units by mouth daily.   . clonazePAM (KLONOPIN) 0.5 MG tablet Take 1 tablet (0.5 mg total) by mouth 2 (two) times daily as needed for anxiety.  . Coenzyme Q10 (CO Q 10 PO) Take 100 mg by mouth daily.   . rosuvastatin (CRESTOR) 10 MG tablet TAKE 1 TABLET BY MOUTH EVERY DAY  . ondansetron (ZOFRAN ODT) 4 MG disintegrating tablet Take 1 tablet (4 mg total) by mouth every 8 (eight) hours as needed for nausea or vomiting. (Patient not taking: Reported on 01/16/2019)  . rosuvastatin (CRESTOR) 20 MG tablet Take 1 tablet (20 mg total) by mouth daily. (Patient not taking: Reported on 07/10/2019)   No facility-administered encounter medications on file as of 07/10/2019.     Activities of Daily Living In your present state of health, do you have any difficulty performing the following activities: 07/10/2019  Hearing? N  Vision? N  Difficulty concentrating or making decisions? N  Walking or climbing stairs? N  Dressing or bathing? N  Doing errands, shopping? N  Preparing Food and eating ? N  Using the Toilet? N  In the past six months, have you accidently leaked urine? N  Do you have problems with loss of bowel control? N  Managing your Medications? N  Managing your Finances? N  Housekeeping or managing your Housekeeping? N  Some recent data might be hidden    Patient Care Team: Glendale Chard, MD as PCP - General (Internal Medicine)    Assessment:   This is a routine wellness examination for H[place]lee.  Exercise Activities and Dietary recommendations Current Exercise Habits: Home exercise routine, Type of exercise: walking, Time (Minutes): 30, Frequency (Times/Week): 5, Weekly Exercise (Minutes/Week): 150  Goals    . Patient Stated     07/10/2019, wants to stay healthy       Fall Risk Fall Risk  07/10/2019 01/16/2019 11/28/2018 11/28/2018 07/18/2018  Falls in the past year? 0 0 1 0 Yes  Comment - - - - -  Number falls in past yr: 0 - 0 - 1  Comment - - - - -  Injury with Fall? - -  1 - Yes  Risk for fall due to : Medication side effect - - - -  Follow up Falls evaluation completed;Education provided;Falls prevention discussed - - - -   Is the patient's home free of loose throw rugs in walkways, pet beds, electrical cords, etc?   yes      Grab bars in the bathroom? no      Handrails on the stairs?  yes      Adequate lighting?   yes  Timed Get Up and Go performed: n/a  Depression Screen PHQ 2/9 Scores 07/10/2019 01/16/2019 11/28/2018 07/18/2018  PHQ - 2 Score 0 0 0 0  PHQ- 9 Score 0 - - -     Cognitive Function     6CIT Screen 07/10/2019  What Year? 0 points  What month? 0 points  What time? 0 points  Count back from 20 0 points  Months in reverse 0 points  Repeat phrase 0 points  Total Score 0    Immunization History  Administered Date(s) Administered  . Tdap 02/24/2018    Qualifies for Shingles Vaccine? yes  Screening Tests Health Maintenance  Topic Date Due  . PNA vac Low Risk Adult (1 of 2 - PCV13) 03/28/2017  . INFLUENZA VACCINE  05/18/2019  . MAMMOGRAM  06/13/2020  . TETANUS/TDAP  02/25/2028  . COLONOSCOPY  05/30/2028  . DEXA SCAN  Completed  . Hepatitis C Screening  Completed    Cancer Screenings: Lung: Low Dose CT Chest recommended if Age 7-80 years, 30 pack-year currently smoking OR have quit w/in 15years. Patient does not qualify. Breast:  Up to date on Mammogram? Yes   Up to date of Bone Density/Dexa? Yes Colorectal: up to date  Additional Screenings: : Hepatitis C Screening: 09/2012     Plan:    Patient wants to stay healthy.   I have personally reviewed and noted the following in the patient's chart:   . Medical and social history . Use of alcohol, tobacco or illicit drugs  . Current medications and supplements . Functional ability and status . Nutritional status . Physical activity . Advanced directives . List of other physicians . Hospitalizations, surgeries, and ER visits in previous 12 months . Vitals .  Screenings to include cognitive, depression, and falls . Referrals and appointments  In addition, I have reviewed and discussed with patient certain preventive protocols, quality metrics, and best practice recommendations. A written personalized care plan for preventive services as well as general preventive health recommendations were provided to patient.     Kellie Simmering, LPN  [id number]

## 2019-07-10 NOTE — Patient Instructions (Signed)
Carolyn Martin , Thank you for taking time to come for your Medicare Wellness Visit. I appreciate your ongoing commitment to your health goals. Please review the following plan we discussed and let me know if I can assist you in the future.   Screening recommendations/referrals: Colonoscopy: 05/2018 Mammogram: 05/2018 Bone Density: 05/2017 Recommended yearly ophthalmology/optometry visit for glaucoma screening and checkup Recommended yearly dental visit for hygiene and checkup  Vaccinations: Influenza vaccine: declines at this time Pneumococcal vaccine: 07/2017 Tdap vaccine: 12/2013 Shingles vaccine: discussed    Advanced directives: Please bring a copy of your POA (Power of [place]) and/or Living Will to your next appointment.    Conditions/risks identified: HTN  Next appointment: 01/07/2020 at 8:30   Preventive Care 13 Years and Older, Female Preventive care refers to lifestyle choices and visits with your health care provider that can promote health and wellness. What does preventive care include?  A yearly physical exam. This is also called an annual well check.  Dental exams once or twice a year.  Routine eye exams. Ask your health care provider how often you should have your eyes checked.  Personal lifestyle choices, including:  Daily care of your teeth and gums.  Regular physical activity.  Eating a healthy diet.  Avoiding tobacco and drug use.  Limiting alcohol use.  Practicing safe sex.  Taking low-dose aspirin every day.  Taking vitamin and mineral supplements as recommended by your health care provider. What happens during an annual well check? The services and screenings done by your health care provider during your annual well check will depend on your age, overall health, lifestyle risk factors, and family history of disease. Counseling  Your health care provider may ask you questions about your:  Alcohol use.  Tobacco use.  Drug use.  Emotional  well-being.  Home and relationship well-being.  Sexual activity.  Eating habits.  History of falls.  Memory and ability to understand (cognition).  Work and work Statistician.  Reproductive health. Screening  You may have the following tests or measurements:  Height, weight, and BMI.  Blood pressure.  Lipid and cholesterol levels. These may be checked every 5 years, or more frequently if you are over 71 years old.  Skin check.  Lung cancer screening. You may have this screening every year starting at age 63 if you have a 30-pack-year history of smoking and currently smoke or have quit within the past 15 years.  Fecal occult blood test (FOBT) of the stool. You may have this test every year starting at age 5.  Flexible sigmoidoscopy or colonoscopy. You may have a sigmoidoscopy every 5 years or a colonoscopy every 10 years starting at age 84.  Hepatitis C blood test.  Hepatitis B blood test.  Sexually transmitted disease (STD) testing.  Diabetes screening. This is done by checking your blood sugar (glucose) after you have not eaten for a while (fasting). You may have this done every 1-3 years.  Bone density scan. This is done to screen for osteoporosis. You may have this done starting at age 77.  Mammogram. This may be done every 1-2 years. Talk to your health care provider about how often you should have regular mammograms. Talk with your health care provider about your test results, treatment options, and if necessary, the need for more tests. Vaccines  Your health care provider may recommend certain vaccines, such as:  Influenza vaccine. This is recommended every year.  Tetanus, diphtheria, and acellular pertussis (Tdap, Td) vaccine. You may need  a Td booster every 10 years.  Zoster vaccine. You may need this after age 75.  Pneumococcal 13-valent conjugate (PCV13) vaccine. One dose is recommended after age 5.  Pneumococcal polysaccharide (PPSV23) vaccine. One  dose is recommended after age 80. Talk to your health care provider about which screenings and vaccines you need and how often you need them. This information is not intended to replace advice given to you by your health care provider. Make sure you discuss any questions you have with your health care provider. Document Released: 10/30/2015 Document Revised: 06/22/2016 Document Reviewed: 08/04/2015 Elsevier Interactive Patient Education  2017 [place] Prevention in the Home Falls can cause injuries. They can happen to people of all ages. There are many things you can do to make your home safe and to help prevent falls. What can I do on the outside of my home?  Regularly fix the edges of walkways and driveways and fix any cracks.  Remove anything that might make you trip as you walk through a door, such as a raised step or threshold.  Trim any bushes or trees on the path to your home.  Use bright outdoor lighting.  Clear any walking paths of anything that might make someone trip, such as rocks or tools.  Regularly check to see if handrails are loose or broken. Make sure that both sides of any steps have handrails.  Any raised decks and porches should have guardrails on the edges.  Have any leaves, snow, or ice cleared regularly.  Use sand or salt on walking paths during winter.  Clean up any spills in your garage right away. This includes oil or grease spills. What can I do in the bathroom?  Use night lights.  Install grab bars by the toilet and in the tub and shower. Do not use towel bars as grab bars.  Use non-skid mats or decals in the tub or shower.  If you need to sit down in the shower, use a plastic, non-slip stool.  Keep the floor dry. Clean up any water that spills on the floor as soon as it happens.  Remove soap buildup in the tub or shower regularly.  Attach bath mats securely with double-sided non-slip rug tape.  Do not have throw rugs and other  things on the floor that can make you trip. What can I do in the bedroom?  Use night lights.  Make sure that you have a light by your bed that is easy to reach.  Do not use any sheets or blankets that are too big for your bed. They should not hang down onto the floor.  Have a firm chair that has side arms. You can use this for support while you get dressed.  Do not have throw rugs and other things on the floor that can make you trip. What can I do in the kitchen?  Clean up any spills right away.  Avoid walking on wet floors.  Keep items that you use a lot in easy-to-reach places.  If you need to reach something above you, use a strong step stool that has a grab bar.  Keep electrical cords out of the way.  Do not use floor polish or wax that makes floors slippery. If you must use wax, use non-skid floor wax.  Do not have throw rugs and other things on the floor that can make you trip. What can I do with my stairs?  Do not leave any items on the  stairs.  Make sure that there are handrails on both sides of the stairs and use them. Fix handrails that are broken or loose. Make sure that handrails are as long as the stairways.  Check any carpeting to make sure that it is firmly attached to the stairs. Fix any carpet that is loose or worn.  Avoid having throw rugs at the top or bottom of the stairs. If you do have throw rugs, attach them to the floor with carpet tape.  Make sure that you have a light switch at the top of the stairs and the bottom of the stairs. If you do not have them, ask someone to add them for you. What else can I do to help prevent falls?  Wear shoes that:  Do not have high heels.  Have rubber bottoms.  Are comfortable and fit you well.  Are closed at the toe. Do not wear sandals.  If you use a stepladder:  Make sure that it is fully opened. Do not climb a closed stepladder.  Make sure that both sides of the stepladder are locked into place.  Ask  someone to hold it for you, if possible.  Clearly mark and make sure that you can see:  Any grab bars or handrails.  First and last steps.  Where the edge of each step is.  Use tools that help you move around (mobility aids) if they are needed. These include:  Canes.  Walkers.  Scooters.  Crutches.  Turn on the lights when you go into a dark area. Replace any light bulbs as soon as they burn out.  Set up your furniture so you have a clear path. Avoid moving your furniture around.  If any of your floors are uneven, fix them.  If there are any pets around you, be aware of where they are.  Review your medicines with your doctor. Some medicines can make you feel dizzy. This can increase your chance of falling. Ask your doctor what other things that you can do to help prevent falls. This information is not intended to replace advice given to you by your health care provider. Make sure you discuss any questions you have with your health care provider. Document Released: 07/30/2009 Document Revised: 03/10/2016 Document Reviewed: 11/07/2014 Elsevier Interactive Patient Education  2017 [employer].

## 2019-07-11 LAB — CBC
Hematocrit: 42.2 % (ref 34.0–46.6)
Hemoglobin: 14.3 g/dL (ref 11.1–15.9)
MCH: 31.7 pg (ref 26.6–33.0)
MCHC: 33.9 g/dL (ref 31.5–35.7)
MCV: 94 fL (ref 79–97)
Platelets: 206 10*3/uL (ref 150–450)
RBC: 4.51 x10E6/uL (ref 3.77–5.28)
RDW: 12.2 % (ref 11.7–15.4)
WBC: 12.8 10*3/uL — ABNORMAL HIGH (ref 3.4–10.8)

## 2019-07-11 LAB — CMP14+EGFR
ALT: 19 IU/L (ref 0–32)
AST: 24 IU/L (ref 0–40)
Albumin/Globulin Ratio: 2.2 (ref 1.2–2.2)
Albumin: 4.7 g/dL (ref 3.8–4.8)
Alkaline Phosphatase: 84 IU/L (ref 39–117)
BUN/Creatinine Ratio: 26 (ref 12–28)
BUN: 24 mg/dL (ref 8–27)
Bilirubin Total: 0.6 mg/dL (ref 0.0–1.2)
CO2: 25 mmol/L (ref 20–29)
Calcium: 9.6 mg/dL (ref 8.7–10.3)
Chloride: 104 mmol/L (ref 96–106)
Creatinine, Ser: 0.92 mg/dL (ref 0.57–1.00)
GFR calc Af Amer: 75 mL/min/{1.73_m2} (ref >59)
GFR calc non Af Amer: 65 mL/min/{1.73_m2} (ref >59)
Globulin, Total: 2.1 g/dL (ref 1.5–4.5)
Glucose: 82 mg/dL (ref 65–99)
Potassium: 4.4 mmol/L (ref 3.5–5.2)
Sodium: 143 mmol/L (ref 134–144)
Total Protein: 6.8 g/dL (ref 6.0–8.5)

## 2019-07-11 LAB — LIPID PANEL
Chol/HDL Ratio: 2.6 ratio (ref 0.0–4.4)
Cholesterol, Total: 166 mg/dL (ref 100–199)
HDL: 64 mg/dL (ref >39)
LDL Chol Calc (NIH): 86 mg/dL (ref 0–99)
Triglycerides: 87 mg/dL (ref 0–149)
VLDL Cholesterol Cal: 16 mg/dL (ref 5–40)

## 2019-07-11 LAB — HEMOGLOBIN A1C
Est. average glucose Bld gHb Est-mCnc: 105 mg/dL
Hgb A1c MFr Bld: 5.3 % (ref 4.8–5.6)

## 2019-07-15 ENCOUNTER — Ambulatory Visit
Admission: RE | Admit: 2019-07-15 | Discharge: 2019-07-15 | Disposition: A | Discharge status: Home / Self Care | Payer: Medicare Other | Source: Ambulatory Visit | Attending: Internal Medicine | Admitting: Internal Medicine

## 2019-07-15 ENCOUNTER — Other Ambulatory Visit: Payer: Self-pay

## 2019-07-15 DIAGNOSIS — Z1231 Encounter for screening mammogram for malignant neoplasm of breast: Secondary | ICD-10-CM | POA: Diagnosis not present

## 2019-07-17 ENCOUNTER — Ambulatory Visit: Payer: Medicare Other

## 2019-10-14 ENCOUNTER — Telehealth: Payer: Self-pay

## 2019-10-14 NOTE — Telephone Encounter (Signed)
The pt was notified that her form for her blood pressure monitor kit has been faxed.  The form was put in the mail to the patient.

## 2019-11-22 ENCOUNTER — Ambulatory Visit: Payer: Medicare Other | Admitting: Cardiovascular Disease

## 2020-01-07 ENCOUNTER — Encounter: Payer: Self-pay | Admitting: Internal Medicine

## 2020-01-07 ENCOUNTER — Ambulatory Visit (INDEPENDENT_AMBULATORY_CARE_PROVIDER_SITE_OTHER): Payer: Medicare Other | Admitting: Internal Medicine

## 2020-01-07 ENCOUNTER — Other Ambulatory Visit: Payer: Self-pay

## 2020-01-07 VITALS — BP 128/76 | HR 70 | Temp 97.9°F | Ht 62.6 in | Wt 134.2 lb

## 2020-01-07 DIAGNOSIS — Z79899 Other long term (current) drug therapy: Secondary | ICD-10-CM

## 2020-01-07 DIAGNOSIS — Z23 Encounter for immunization: Secondary | ICD-10-CM

## 2020-01-07 DIAGNOSIS — G250 Essential tremor: Secondary | ICD-10-CM | POA: Diagnosis not present

## 2020-01-07 DIAGNOSIS — I2583 Coronary atherosclerosis due to lipid rich plaque: Secondary | ICD-10-CM | POA: Diagnosis not present

## 2020-01-07 DIAGNOSIS — I2510 Atherosclerotic heart disease of native coronary artery without angina pectoris: Secondary | ICD-10-CM | POA: Diagnosis not present

## 2020-01-07 DIAGNOSIS — E7800 Pure hypercholesterolemia, unspecified: Secondary | ICD-10-CM

## 2020-01-07 DIAGNOSIS — I119 Hypertensive heart disease without heart failure: Secondary | ICD-10-CM | POA: Diagnosis not present

## 2020-01-07 DIAGNOSIS — L989 Disorder of the skin and subcutaneous tissue, unspecified: Secondary | ICD-10-CM

## 2020-01-07 MED ORDER — ROSUVASTATIN CALCIUM 10 MG PO TABS: 10.0000 mg | ORAL_TABLET | Freq: Every day | ORAL | 1 refills | Status: DC

## 2020-01-07 MED ORDER — CLONAZEPAM 0.5 MG PO TABS: 0.5000 mg | ORAL_TABLET | Freq: Every day | ORAL | 1 refills | Status: DC | PRN

## 2020-01-07 MED ORDER — PNEUMOCOCCAL 13-VAL CONJ VACC IM SUSP: 0.5000 mL | INTRAMUSCULAR | 0 refills | Status: AC

## 2020-01-07 NOTE — Patient Instructions (Signed)

## 2020-01-07 NOTE — Progress Notes (Signed)
This visit occurred during the SARS-CoV-2 public health emergency.  Safety protocols were in place, including screening questions prior to the visit, additional usage of staff PPE, and extensive cleaning of exam room while observing appropriate contact time as indicated for disinfecting solutions.  Subjective:     Patient ID: Carolyn Martin , female    DOB: 1951/08/21 , 69 y.o.   MRN: [id number]   Chief Complaint  Patient presents with  . Hypertension  . Hyperlipidemia  . Immunizations    pneumonia     HPI  She presents today for bp/chol check. She reports compliance with meds. She remains active, plays with grandkids daily. She reports she tried to get pneumonia shot at pharmacy, but they would not give to her. She does not wish to get COVID vaccine.   Hypertension This is a chronic problem. The current episode started more than 1 year ago. The problem has been gradually improving since onset. The problem is controlled. Pertinent negatives include no blurred vision, chest pain, palpitations or shortness of breath. Risk factors for coronary artery disease include post-menopausal state and dyslipidemia. Past treatments include lifestyle changes. The current treatment provides moderate improvement. There are no compliance problems.  Hypertensive end-organ damage includes CAD/MI.  Hyperlipidemia This is a chronic problem. The problem is controlled. Pertinent negatives include no chest pain or shortness of breath. Risk factors for coronary artery disease include dyslipidemia and post-menopausal.     Past Medical History:  Diagnosis Date  . Anginal pain ([place]) 05/14/2008   atypical chest pain-- Exercise  Myoview  EF 61% ,mod to severe ischemia Basal Anterior ,Anteroseptal,Mid Anterior,Mid Anteroseptal, Apical Anterior and Apical Septal regions   LV normal   . Coronary artery disease 06/23/2010   Echo EF =>55%,LV normal  . Hyperlipidemia   . Hypertension   . Sleep apnea   . Tremor,  essential 02/19/2016     Family History  Problem Relation Age of Onset  . Diabetes Mother   . Hypertension Mother   . Kidney failure Mother   . Transient ischemic attack Father   . Coronary artery disease Father   . Dementia Father      Current Outpatient Medications:  .  Ascorbic Acid (VITAMIN C PO), Take 500 mg by mouth daily. , Disp: , Rfl:  .  B Complex Vitamins (B COMPLEX 100 PO), Take 100 mg by mouth daily., Disp: , Rfl:  .  Cholecalciferol (VITAMIN D-3 PO), Take 5,000 Units by mouth daily. , Disp: , Rfl:  .  clonazePAM (KLONOPIN) 0.5 MG tablet, Take 1 tablet (0.5 mg total) by mouth daily as needed for anxiety., Disp: 30 tablet, Rfl: 1 .  Coenzyme Q10 (CO Q 10 PO), Take 100 mg by mouth daily. , Disp: , Rfl:  .  rosuvastatin (CRESTOR) 10 MG tablet, Take 1 tablet (10 mg total) by mouth daily., Disp: 90 tablet, Rfl: 1 .  ondansetron (ZOFRAN ODT) 4 MG disintegrating tablet, Take 1 tablet (4 mg total) by mouth every 8 (eight) hours as needed for nausea or vomiting. (Patient not taking: Reported on 01/07/2020), Disp: 10 tablet, Rfl: 0 .  pneumococcal 13-valent conjugate vaccine (PREVNAR 13) SUSP injection, Inject 0.5 mLs into the muscle tomorrow at 10 am for 1 dose., Disp: 0.5 mL, Rfl: 0   Allergies  Allergen Reactions  . Iodine Hives  . Sulfa Antibiotics      Review of Systems  Constitutional: Negative.   Eyes: Negative for blurred vision.  Respiratory: Negative.  Negative for  shortness of breath.   Cardiovascular: Negative.  Negative for chest pain and palpitations.  Gastrointestinal: Negative.   Skin:       Wants to get referral to Derm. Wants full body skin check.  Neurological: Negative.   Psychiatric/Behavioral: Negative.      Today's Vitals   01/07/20 0840  BP: 128/76  Pulse: 70  Temp: 97.9 F (36.6 C)  TempSrc: Oral  Weight: 134 lb 3.2 oz (60.9 kg)  Height: 5' 2.6" (1.59 m)  PainSc: 5   PainLoc: Generalized   Body mass index is 24.08 kg/m.   Objective:   Physical Exam Vitals and nursing note reviewed.  Constitutional:      Appearance: Normal appearance.  HENT:     Head: Normocephalic and atraumatic.  Cardiovascular:     Rate and Rhythm: Normal rate and regular rhythm.     Heart sounds: Normal heart sounds.  Pulmonary:     Effort: Pulmonary effort is normal.     Breath sounds: Normal breath sounds.  Skin:    General: Skin is warm.     Findings: Lesion present.     Comments: Flesh colored lesion on left flank, with hyperpigmentation  Neurological:     General: No focal deficit present.     Mental Status: She is alert.     Comments: Tremor present  Psychiatric:        Mood and Affect: Mood normal.        Behavior: Behavior normal.         Assessment And Plan:     1. Benign hypertensive heart disease without heart failure  Chronic, well controlled. She is no longer taking medication. She weaned herself off of the medication. She prefers to control with lifestyle modification.  I will check renal function today.   - CMP14+EGFR  2. Coronary artery disease due to lipid rich plaque  S/p angioplasty w/ stent placement. Has upcoming f/u with Cardiology.   3. Pure hypercholesterolemia  Chronic, this has been stable. I will check fasting lipid panel and LFTs today. I will make further recommendations once her labs are available for review.   - Lipid panel  4. Tremor, essential  Chronic. She was given refill of clonazepam to use prn. Review of the [place] CSRS was performed in accordance of the [place] prior to dispensing any controlled drugs.  5. Skin lesion  I will refer her to Derm for further evaluation, possible excision.   - Ambulatory referral to Dermatology  6. Need for vaccination  Rx [id number] was sent to her local pharmacy. She does not wish to get COVID vaccine at this time.   - pneumococcal 13-valent conjugate vaccine (PREVNAR 13) SUSP injection; Inject 0.5 mLs into the muscle tomorrow at 10 am for 1 dose.   Dispense: 0.5 mL; Refill: 0  7. Long term prescription benzodiazepine use  I will check UDS today.   - Urine Drug Panel 7    [name], MD    THE PATIENT IS ENCOURAGED TO PRACTICE SOCIAL DISTANCING DUE TO THE COVID-19 PANDEMIC.

## 2020-01-08 LAB — CMP14+EGFR
ALT: 18 IU/L (ref 0–32)
AST: 24 IU/L (ref 0–40)
Albumin/Globulin Ratio: 2 (ref 1.2–2.2)
Albumin: 4.6 g/dL (ref 3.8–4.8)
Alkaline Phosphatase: 81 IU/L (ref 39–117)
BUN/Creatinine Ratio: 21 (ref 12–28)
BUN: 17 mg/dL (ref 8–27)
Bilirubin Total: 0.5 mg/dL (ref 0.0–1.2)
CO2: 26 mmol/L (ref 20–29)
Calcium: 9.9 mg/dL (ref 8.7–10.3)
Chloride: 104 mmol/L (ref 96–106)
Creatinine, Ser: 0.81 mg/dL (ref 0.57–1.00)
GFR calc Af Amer: 87 mL/min/{1.73_m2} (ref >59)
GFR calc non Af Amer: 75 mL/min/{1.73_m2} (ref >59)
Globulin, Total: 2.3 g/dL (ref 1.5–4.5)
Glucose: 82 mg/dL (ref 65–99)
Potassium: 4.5 mmol/L (ref 3.5–5.2)
Sodium: 143 mmol/L (ref 134–144)
Total Protein: 6.9 g/dL (ref 6.0–8.5)

## 2020-01-08 LAB — URINE DRUG PANEL 7
Amphetamines, Urine: NEGATIVE ng/mL
Barbiturate Quant, Ur: NEGATIVE ng/mL
Benzodiazepine Quant, Ur: NEGATIVE ng/mL
Cannabinoid Quant, Ur: NEGATIVE ng/mL
Cocaine (Metab.): NEGATIVE ng/mL
Opiate Quant, Ur: NEGATIVE ng/mL
PCP Quant, Ur: NEGATIVE ng/mL

## 2020-01-08 LAB — LIPID PANEL
Chol/HDL Ratio: 2.8 ratio (ref 0.0–4.4)
Cholesterol, Total: 190 mg/dL (ref 100–199)
HDL: 67 mg/dL (ref >39)
LDL Chol Calc (NIH): 104 mg/dL — ABNORMAL HIGH (ref 0–99)
Triglycerides: 107 mg/dL (ref 0–149)
VLDL Cholesterol Cal: 19 mg/dL (ref 5–40)

## 2020-02-25 ENCOUNTER — Other Ambulatory Visit: Payer: Self-pay

## 2020-02-25 ENCOUNTER — Ambulatory Visit (INDEPENDENT_AMBULATORY_CARE_PROVIDER_SITE_OTHER): Payer: Medicare Other | Admitting: Cardiovascular Disease

## 2020-02-25 ENCOUNTER — Encounter: Payer: Self-pay | Admitting: Cardiovascular Disease

## 2020-02-25 VITALS — BP 130/68 | HR 63 | Temp 98.2°F

## 2020-02-25 DIAGNOSIS — E782 Mixed hyperlipidemia: Secondary | ICD-10-CM | POA: Diagnosis not present

## 2020-02-25 DIAGNOSIS — I10 Essential (primary) hypertension: Secondary | ICD-10-CM | POA: Diagnosis not present

## 2020-02-25 DIAGNOSIS — I2583 Coronary atherosclerosis due to lipid rich plaque: Secondary | ICD-10-CM | POA: Diagnosis not present

## 2020-02-25 DIAGNOSIS — I2510 Atherosclerotic heart disease of native coronary artery without angina pectoris: Secondary | ICD-10-CM

## 2020-02-25 MED ORDER — ROSUVASTATIN CALCIUM 40 MG PO TABS: 40.0000 mg | ORAL_TABLET | Freq: Every day | ORAL | 3 refills | Status: DC

## 2020-02-25 NOTE — Patient Instructions (Signed)
Medication Instructions:  INCREASE ROSUVASTATIN TO 40 MG ONCE DAILY  *If you need a refill on your cardiac medications before your next appointment, please call your pharmacy*   Lab Work: Your physician recommends that you return for lab work in: 2 [place]  If you have labs (blood work) drawn today and your tests are completely normal, you will receive your results only by: Marland Kitchen MyChart Message (if you have MyChart) OR . A paper copy in the mail If you have any lab test that is abnormal or we need to change your treatment, we will call you to review the results.  Follow-Up: At [facility], you and your health needs are our priority.  As part of our continuing mission to provide you with exceptional heart care, we have created designated Provider Care Teams.  These Care Teams include your primary Cardiologist (physician) and Advanced Practice Providers (APPs -  Physician Assistants and Nurse Practitioners) who all work together to provide you with the care you need, when you need it.  We recommend signing up for the patient portal called "MyChart".  Sign up information is provided on this After Visit Summary.  MyChart is used to connect with patients for Virtual Visits (Telemedicine).  Patients are able to view lab/test results, encounter notes, upcoming appointments, etc.  Non-urgent messages can be sent to your provider as well.   To learn more about what you can do with MyChart, go to [email or web address].    Your next appointment:   12 month(s)  The format for your next appointment:   Either In Person or Virtual  Provider:   You may see Quay Burow MD or one of the following Advanced Practice Providers on your designated Care Team:    Kerin Ransom, PA-C  [place], [place]  Coletta Memos, [place]

## 2020-02-25 NOTE — Assessment & Plan Note (Signed)
History of hyperlipidemia on Crestor 10 to 20 mg a day with lipid profile performed 01/07/2020 revealing total cholesterol 190, LDL 104 and HDL 67.  She is not at goal for secondary prevention.  I am going to increase her Crestor to 40 mg a day and we will recheck a lipid liver profile in 2 months

## 2020-02-25 NOTE — Progress Notes (Signed)
02/25/2020 Carolyn Martin   11/26/1950  [id number]  Primary Physician Glendale Chard, MD Primary Cardiologist: Lorretta Harp MD Garret Reddish, [place], [place]  HPI:  Carolyn Martin is a 69 y.o.    thin-appearing married Caucasian female mother of 2, grandmother 2 grandchildren who I last saw in the office 10/24/2018.  She is retired from working at the [employer] for 32 years.  I last saw her in the office 01/21/2016 risk factors include treated hypertension and hyperlipidemia. Her mother did have coronary artery bypass grafting in her 76s. She had LAD stenting, Dr. Melvern Banker using 3 sequential bare-metal stents 05/15/08.   Because of occasional chest pain when I saw her last I obtained a Myoview stress test 02/05/16 which was entirely normal.  She has had no recurrent symptoms.  Her most recent lab work performed 01/07/2020 revealed a total cholesterol 190, LDL 104 and HDL of 67.  Since I saw her a year ago she is remained stable.  She does walk 5 days a week and still mows her lawn with a push number.  She denies chest pain or shortness of breath.  Current Meds  Medication Sig  . Ascorbic Acid (VITAMIN C PO) Take 500 mg by mouth daily.   . B Complex Vitamins (B COMPLEX 100 PO) Take 100 mg by mouth daily.  . Cholecalciferol (VITAMIN D-3 PO) Take 5,000 Units by mouth daily.   . clonazePAM (KLONOPIN) 0.5 MG tablet Take 1 tablet (0.5 mg total) by mouth daily as needed for anxiety.  . Coenzyme Q10 (CO Q 10 PO) Take 100 mg by mouth daily.   . rosuvastatin (CRESTOR) 40 MG tablet Take 1 tablet (40 mg total) by mouth daily.  . [DISCONTINUED] rosuvastatin (CRESTOR) 10 MG tablet Take 1 tablet (10 mg total) by mouth daily. (Patient taking differently: Take 20 mg by mouth daily. )     Allergies  Allergen Reactions  . Iodine Hives  . Sulfa Antibiotics     Social History   Socioeconomic History  . Marital status: Married    Spouse name: Hendricks Milo  . Number of children: 2  . Years of education: 32   . Highest education level: Not on file  Occupational History  . Occupation: Retired    Fish farm manager: [employer]  Tobacco Use  . Smoking status: Former Smoker    Types: Cigarettes    Quit date: 12/17/2006    Years since quitting: 13.2  . Smokeless tobacco: Never Used  Substance and Sexual Activity  . Alcohol use: No    Alcohol/week: 0.0 standard drinks  . Drug use: No  . Sexual activity: Yes  Other Topics Concern  . Not on file  Social History Narrative   Lives w/ husband   Right-handed   Drinks about 12 oz caffeinated beverage per day   Social Determinants of Health   Financial Resource Strain: Low Risk   . Difficulty of Paying Living Expenses: Not hard at all  Food Insecurity: No Food Insecurity  . Worried About Charity fundraiser in the Last Year: Never true  . Ran Out of Food in the Last Year: Never true  Transportation Needs: No Transportation Needs  . Lack of Transportation (Medical): No  . Lack of Transportation (Non-Medical): No  Physical Activity: Sufficiently Active  . Days of Exercise per Week: 5 days  . Minutes of Exercise per Session: 30 min  Stress: No Stress Concern Present  . Feeling of Stress : Not  at all  Social Connections:   . Frequency of Communication with Friends and Family:   . Frequency of Social Gatherings with Friends and Family:   . Attends Religious Services:   . Active Member of Clubs or Organizations:   . Attends Archivist Meetings:   Marland Kitchen Marital Status:   Intimate Partner Violence: Not At Risk  . Fear of Current or Ex-Partner: No  . Emotionally Abused: No  . Physically Abused: No  . Sexually Abused: No     Review of Systems: General: negative for chills, fever, night sweats or weight changes.  Cardiovascular: negative for chest pain, dyspnea on exertion, edema, orthopnea, palpitations, paroxysmal nocturnal dyspnea or shortness of breath Dermatological: negative for rash Respiratory: negative for cough or  wheezing Urologic: negative for hematuria Abdominal: negative for nausea, vomiting, diarrhea, bright red blood per rectum, melena, or hematemesis Neurologic: negative for visual changes, syncope, or dizziness All other systems reviewed and are otherwise negative except as noted above.    Blood pressure 130/68, pulse 63, temperature 98.2 F (36.8 C).  General appearance: alert and no distress Neck: no adenopathy, no carotid bruit, no JVD, supple, symmetrical, trachea midline and thyroid not enlarged, symmetric, no tenderness/mass/nodules Lungs: clear to auscultation bilaterally Heart: regular rate and rhythm, S1, S2 normal, no murmur, click, rub or gallop Extremities: extremities normal, atraumatic, no cyanosis or edema Pulses: 2+ and symmetric Skin: Skin color, texture, turgor normal. No rashes or lesions Neurologic: Alert and oriented X 3, normal strength and tone. Normal symmetric reflexes. Normal coordination and gait  EKG sinus rhythm at 63 without ST or T wave changes.  I personally reviewed this EKG.  ASSESSMENT AND PLAN:   Essential hypertension History of essential hypertension with blood pressure measured today at 130/68.  She is not on any antihypertensive medications.  Hyperlipidemia History of hyperlipidemia on Crestor 10 to 20 mg a day with lipid profile performed 01/07/2020 revealing total cholesterol 190, LDL 104 and HDL 67.  She is not at goal for secondary prevention.  I am going to increase her Crestor to 40 mg a day and we will recheck a lipid liver profile in 2 months  Coronary artery disease due to lipid rich plaque History of CAD status post remote stenting of her LAD by Dr. Melvern Banker with 3 sequential bare-metal stents 05/15/2008.  She did have a negative Myoview performed 02/05/2016.  She denies chest pain or shortness of breath.      Lorretta Harp MD FACP,FACC,FAHA, [facility] 02/25/2020 8:10 AM

## 2020-02-25 NOTE — Assessment & Plan Note (Signed)
History of CAD status post remote stenting of her LAD by Dr. Melvern Banker with 3 sequential bare-metal stents 05/15/2008.  She did have a negative Myoview performed 02/05/2016.  She denies chest pain or shortness of breath.

## 2020-02-25 NOTE — Assessment & Plan Note (Signed)
History of essential hypertension with blood pressure measured today at 130/68.  She is not on any antihypertensive medications.

## 2020-03-07 ENCOUNTER — Encounter: Payer: Self-pay | Admitting: Emergency Medicine

## 2020-03-07 ENCOUNTER — Other Ambulatory Visit: Payer: Self-pay

## 2020-03-07 ENCOUNTER — Emergency Department
Admission: EM | Admit: 2020-03-07 | Discharge: 2020-03-07 | Disposition: A | Discharge status: Home / Self Care | Payer: Medicare Other | Attending: Emergency Medicine | Admitting: Emergency Medicine

## 2020-03-07 ENCOUNTER — Emergency Department: Payer: Medicare Other

## 2020-03-07 DIAGNOSIS — I2510 Atherosclerotic heart disease of native coronary artery without angina pectoris: Secondary | ICD-10-CM | POA: Insufficient documentation

## 2020-03-07 DIAGNOSIS — H539 Unspecified visual disturbance: Secondary | ICD-10-CM | POA: Diagnosis not present

## 2020-03-07 DIAGNOSIS — H538 Other visual disturbances: Secondary | ICD-10-CM | POA: Diagnosis present

## 2020-03-07 DIAGNOSIS — I10 Essential (primary) hypertension: Secondary | ICD-10-CM | POA: Diagnosis not present

## 2020-03-07 DIAGNOSIS — Z955 Presence of coronary angioplasty implant and graft: Secondary | ICD-10-CM | POA: Insufficient documentation

## 2020-03-07 DIAGNOSIS — Z79899 Other long term (current) drug therapy: Secondary | ICD-10-CM | POA: Insufficient documentation

## 2020-03-07 DIAGNOSIS — Z87891 Personal history of nicotine dependence: Secondary | ICD-10-CM | POA: Insufficient documentation

## 2020-03-07 DIAGNOSIS — R9431 Abnormal electrocardiogram [ECG] [EKG]: Secondary | ICD-10-CM | POA: Diagnosis not present

## 2020-03-07 DIAGNOSIS — R29818 Other symptoms and signs involving the nervous system: Secondary | ICD-10-CM | POA: Diagnosis not present

## 2020-03-07 LAB — DIFFERENTIAL
Abs Immature Granulocytes: 0.02 10*3/uL (ref 0.00–0.07)
Basophils Absolute: 0.1 10*3/uL (ref 0.0–0.1)
Basophils Relative: 1 %
Eosinophils Absolute: 0.2 10*3/uL (ref 0.0–0.5)
Eosinophils Relative: 1 %
Immature Granulocytes: 0 %
Lymphocytes Relative: 69 %
Lymphs Abs: 11.1 10*3/uL — ABNORMAL HIGH (ref 0.7–4.0)
Monocytes Absolute: 0.6 10*3/uL (ref 0.1–1.0)
Monocytes Relative: 4 %
Neutro Abs: 4 10*3/uL (ref 1.7–7.7)
Neutrophils Relative %: 25 %
Smear Review: NORMAL

## 2020-03-07 LAB — CBC
HCT: 41.6 % (ref 36.0–46.0)
Hemoglobin: 13.8 g/dL (ref 12.0–15.0)
MCH: 31.8 pg (ref 26.0–34.0)
MCHC: 33.2 g/dL (ref 30.0–36.0)
MCV: 95.9 fL (ref 80.0–100.0)
Platelets: 224 10*3/uL (ref 150–400)
RBC: 4.34 MIL/uL (ref 3.87–5.11)
RDW: 12.7 % (ref 11.5–15.5)
WBC: 15.9 10*3/uL — ABNORMAL HIGH (ref 4.0–10.5)
nRBC: 0.2 % (ref 0.0–0.2)

## 2020-03-07 LAB — PROTIME-INR
INR: 0.9 (ref 0.8–1.2)
Prothrombin Time: 11.7 seconds (ref 11.4–15.2)

## 2020-03-07 LAB — COMPREHENSIVE METABOLIC PANEL
ALT: 29 U/L (ref 0–44)
AST: 30 U/L (ref 15–41)
Albumin: 4.4 g/dL (ref 3.5–5.0)
Alkaline Phosphatase: 74 U/L (ref 38–126)
Anion gap: 10 (ref 5–15)
BUN: 15 mg/dL (ref 8–23)
CO2: 25 mmol/L (ref 22–32)
Calcium: 9.7 mg/dL (ref 8.9–10.3)
Chloride: 105 mmol/L (ref 98–111)
Creatinine, Ser: 0.84 mg/dL (ref 0.44–1.00)
GFR calc Af Amer: >60 mL/min (ref >60)
GFR calc non Af Amer: >60 mL/min (ref >60)
Glucose, Bld: 99 mg/dL (ref 70–99)
Potassium: 3.9 mmol/L (ref 3.5–5.1)
Sodium: 140 mmol/L (ref 135–145)
Total Bilirubin: 0.7 mg/dL (ref 0.3–1.2)
Total Protein: 7.4 g/dL (ref 6.5–8.1)

## 2020-03-07 LAB — APTT: aPTT: 27 seconds (ref 24–36)

## 2020-03-07 LAB — GLUCOSE, CAPILLARY: Glucose-Capillary: 74 mg/dL (ref 70–99)

## 2020-03-07 MED ORDER — SODIUM CHLORIDE 0.9% FLUSH: 3.0000 mL | Freq: Once | INTRAVENOUS | Status: DC

## 2020-03-07 NOTE — Discharge Instructions (Signed)
Your labs and CT scan of the head today were all normal.  Please follow-up with your doctor for continued monitoring of the symptoms.

## 2020-03-07 NOTE — ED Triage Notes (Signed)
Pt to ED via POV. Pt states that around 35 minutes PTA she had some changes in her vision. Pt states that vision changes were in both eyes. Pt states that symptoms have since resolved. Pt denies any other symptoms. Pt is in NAD.

## 2020-03-07 NOTE — ED Notes (Addendum)
Pt states she experienced an episode of blind spots within visual fields of both eyes during a bike ride this morning. Episode resolved shortly after it began and vision is at baseline at this time. Pt wears glasses for reading. Pt denies head injury.

## 2020-03-07 NOTE — ED Triage Notes (Addendum)
First nurse note-pt ambulatory, declined wheel chair.  Happened 30 min PTA, lasted about 15 min per pt.  Vision back to normal now.  Reports half moon shape bilateral where she felt she could not see that part of the visual field.  No other focal deficits per pt.  No facial droop, speech clear. No weakness.

## 2020-03-07 NOTE — ED Provider Notes (Signed)
[facility] Emergency Department Provider Note  ____________________________________________  Time seen: Approximately 12:33 PM  I have reviewed the triage vital signs and the nursing notes.   HISTORY  Chief Complaint Visual Field Change    HPI Carolyn Martin is a 69 y.o. female with a history of CAD hypertension hyperlipidemia who comes the ED complaining of an episode of visual disturbance in both eyes that happened at about 10:45 AM today.  She had just started a bike ride, when it occurred, characterized by a strip of blurriness in the central area of her vision, vertically oriented.  Lasted 15 minutes and then resolved.  No associated paresthesias or weakness, no change in balance or coordination.  She did not fall off the bike.  She instead got off and walked to her sister's house a block away who suggested it might be an aura for an impending migraine which runs in the patient's family but she has no personal history of.  No other recent symptoms.  After visual disturbance resolved around 11:00 AM, she has been feeling completely normal since then.  Normal oral intake, normal state of health recently.  No headache.   Patient also notes that she walks regularly including this morning when she took a 30-minute walk without any symptoms.   Past Medical History:  Diagnosis Date  . Anginal pain ([place]) 05/14/2008   atypical chest pain-- Exercise  Myoview  EF 61% ,mod to severe ischemia Basal Anterior ,Anteroseptal,Mid Anterior,Mid Anteroseptal, Apical Anterior and Apical Septal regions   LV normal   . Coronary artery disease 06/23/2010   Echo EF =>55%,LV normal  . Hyperlipidemia   . Hypertension   . Sleep apnea   . Tremor, essential 02/19/2016     Patient Active Problem List   Diagnosis Date Noted  . Personal history of colonic polyps 01/16/2019  . Anxiety 07/18/2018  . Vitamin D insufficiency 07/18/2018  . Chronic kidney disease, stage II (mild)  05/31/2018  . Tremor, essential 02/19/2016  . Essential hypertension 01/21/2016  . Hyperlipidemia 01/21/2016  . Coronary artery disease due to lipid rich plaque 01/21/2016     Past Surgical History:  Procedure Laterality Date  . CARDIAC CATHETERIZATION  05/15/2008   2 areas of LAD  . CORONARY ANGIOPLASTY WITH STENT PLACEMENT  05/15/2008    proximal LAD -MiniVision stent 2.5 x 12 and mid LAD 2 overlapping MiniVision 2.0 x 15     Prior to Admission medications   Medication Sig Start Date End Date Taking? Authorizing Provider  Ascorbic Acid (VITAMIN C PO) Take 500 mg by mouth daily.     [provider]  B Complex Vitamins (B COMPLEX 100 PO) Take 100 mg by mouth daily.    [provider]  Blood Pressure Monitoring (OMRON 5 SERIES BP MONITOR) DEVI  10/14/19   [provider]  Cholecalciferol (VITAMIN D-3 PO) Take 5,000 Units by mouth daily.     [provider]  clonazePAM (KLONOPIN) 0.5 MG tablet Take 1 tablet (0.5 mg total) by mouth daily as needed for anxiety. 01/07/20   Glendale Chard, MD  Coenzyme Q10 (CO Q 10 PO) Take 100 mg by mouth daily.     [provider]  rosuvastatin (CRESTOR) 40 MG tablet Take 1 tablet (40 mg total) by mouth daily. 02/25/20   Lorretta Harp, MD     Allergies Iodine and Sulfa antibiotics   Family History  Problem Relation Age of Onset  . Diabetes Mother   .  Hypertension Mother   . Kidney failure Mother   . Transient ischemic attack Father   . Coronary artery disease Father   . Dementia Father     Social History Social History   Tobacco Use  . Smoking status: Former Smoker    Types: Cigarettes    Quit date: 12/17/2006    Years since quitting: 13.2  . Smokeless tobacco: Never Used  Substance Use Topics  . Alcohol use: No    Alcohol/week: 0.0 standard drinks  . Drug use: No    Review of Systems  Constitutional:   No fever or chills.  ENT:   No sore throat. No rhinorrhea. Cardiovascular:   No  chest pain or syncope. Respiratory:   No dyspnea or cough. Gastrointestinal:   Negative for abdominal pain, vomiting and diarrhea.  Musculoskeletal:   Negative for focal pain or swelling All other systems reviewed and are negative except as documented above in ROS and HPI.  ____________________________________________   PHYSICAL EXAM:  VITAL SIGNS: ED Triage Vitals [03/07/20 1117]  Enc Vitals Group     BP (!) 166/94     Pulse Rate 100     Resp 18     Temp 98 F (36.7 C)     Temp Source Oral     SpO2 100 %     Weight 134 lb (60.8 kg)     Height 5\' 2"  (1.575 m)     Head Circumference      Peak Flow      Pain Score 0     Pain Loc      Pain Edu?      Excl. in [place]?     Vital signs reviewed, nursing assessments reviewed.   Constitutional:   Alert and oriented. Non-toxic appearance. Eyes:   Conjunctivae are normal. EOMI. PERRL.  No nystagmus ENT      Head:   Normocephalic and atraumatic.      Nose:   Normal .      Mouth/Throat:   Normal      Neck:   No meningismus. Full ROM. Hematological/Lymphatic/Immunilogical:   No cervical lymphadenopathy. Cardiovascular:   RRR. Symmetric bilateral radial and DP pulses.  No murmurs. Cap refill less than 2 seconds.  No carotid bruit or murmur Respiratory:   Normal respiratory effort without tachypnea/retractions. Breath sounds are clear and equal bilaterally. No wheezes/rales/rhonchi. Gastrointestinal:   Soft and nontender. Non distended. There is no CVA tenderness.  No rebound, rigidity, or guarding.  Musculoskeletal:   Normal range of motion in all extremities. No joint effusions.  No lower extremity tenderness.  No edema. Neurologic:   Normal speech and language.  Cranial nerves III through XII intact Normal cerebellar function Motor grossly intact. No acute focal neurologic deficits are appreciated.  Skin:    Skin is warm, dry and intact. No rash noted.  No petechiae, purpura, or  bullae.  ____________________________________________    LABS (pertinent positives/negatives) (all labs ordered are listed, but only abnormal results are displayed) Labs Reviewed  CBC - Abnormal; Notable for the following components:      Result Value   WBC 15.9 (*)    All other components within normal limits  DIFFERENTIAL - Abnormal; Notable for the following components:   Lymphs Abs 11.1 (*)    All other components within normal limits  PROTIME-INR  APTT  COMPREHENSIVE METABOLIC PANEL  GLUCOSE, CAPILLARY  CBG MONITORING, ED   ____________________________________________   EKG  Interpreted by me Normal sinus rhythm rate  of 100, normal axis and intervals.  Normal QRS ST segments and T waves.  ____________________________________________    RADIOLOGY  CT HEAD WO CONTRAST  Result Date: 03/07/2020 CLINICAL DATA:  69 year old female with a history possible stroke EXAM: CT HEAD WITHOUT CONTRAST TECHNIQUE: Contiguous axial images were obtained from the base of the skull through the vertex without intravenous contrast. COMPARISON:  02/24/2018 FINDINGS: Brain: No acute intracranial hemorrhage. No midline shift or mass effect. Gray-white differentiation maintained. Unremarkable appearance of the ventricular system. Vascular: Intracranial atherosclerosis. Skull: No acute fracture.  No aggressive bone lesion identified. Sinuses/Orbits: Unremarkable appearance of the orbits. Mastoid air cells clear. No middle ear effusion. No significant sinus disease. Other: None IMPRESSION: Negative for acute intracranial abnormality. Electronically Signed   By: Corrie Mckusick D.O.   On: 03/07/2020 12:12    ____________________________________________   PROCEDURES Procedures  ____________________________________________    CLINICAL IMPRESSION / ASSESSMENT AND PLAN / ED COURSE  Medications ordered in the ED: Medications  sodium chloride flush (NS) 0.9 % injection 3 mL (3 mLs Intravenous Not  Given 03/07/20 1142)    Pertinent labs & imaging results that were available during my care of the patient were reviewed by me and considered in my medical decision making (see chart for details).  Carolyn Martin was evaluated in Emergency Department on 03/07/2020 for the symptoms described in the history of present illness. She was evaluated in the context of the global COVID-19 pandemic, which necessitated consideration that the patient might be at risk for infection with the SARS-CoV-2 virus that causes COVID-19. Institutional protocols and algorithms that pertain to the evaluation of patients at risk for COVID-19 are in a state of rapid change based on information released by regulatory bodies including the CDC and federal and state organizations. These policies and algorithms were followed during the patient's care in the ED.   Patient presents with a brief visual disturbance, no other neurologic symptoms, resolved by the time she arrived in the ED.  Lab work-up and CT scan of the head obtained which are all unremarkable, vital signs are normal, neuro exam is normal at this time.  Doubt a stroke or intracranial hemorrhage, doubt TIA.  Recommend follow-up with primary care who can consider carotid ultrasound or MRI as warranted, but currently no benefit to hospitalization.  Patient is agreeable with the plan for discharge and outpatient follow-up with primary care.      ____________________________________________   FINAL CLINICAL IMPRESSION(S) / ED DIAGNOSES    Final diagnoses:  Visual disturbance     ED Discharge Orders    None      Portions of this note were generated with dragon dictation software. Dictation errors may occur despite best attempts at proofreading.   Carrie Mew, MD 03/07/20 [phone]

## 2020-03-07 NOTE — ED Notes (Signed)
Discussed with dr Joni Fears. CVA work up, do not call code stroke at this time.

## 2020-03-07 NOTE — ED Notes (Signed)
Pt trx to CT.  

## 2020-03-07 NOTE — ED Notes (Signed)
Pt's husband at bedside at this time.

## 2020-03-24 ENCOUNTER — Ambulatory Visit: Payer: Medicare Other | Admitting: Internal Medicine

## 2020-05-05 DIAGNOSIS — E782 Mixed hyperlipidemia: Secondary | ICD-10-CM | POA: Diagnosis not present

## 2020-05-06 LAB — HEPATIC FUNCTION PANEL
ALT: 28 IU/L (ref 0–32)
AST: 24 IU/L (ref 0–40)
Albumin: 4.6 g/dL (ref 3.8–4.8)
Alkaline Phosphatase: 83 IU/L (ref 48–121)
Bilirubin Total: 0.5 mg/dL (ref 0.0–1.2)
Bilirubin, Direct: 0.13 mg/dL (ref 0.00–0.40)
Total Protein: 6.6 g/dL (ref 6.0–8.5)

## 2020-05-06 LAB — LIPID PANEL
Chol/HDL Ratio: 2.7 ratio (ref 0.0–4.4)
Cholesterol, Total: 161 mg/dL (ref 100–199)
HDL: 60 mg/dL (ref >39)
LDL Chol Calc (NIH): 81 mg/dL (ref 0–99)
Triglycerides: 109 mg/dL (ref 0–149)
VLDL Cholesterol Cal: 20 mg/dL (ref 5–40)

## 2020-05-07 ENCOUNTER — Telehealth: Payer: Self-pay

## 2020-05-07 DIAGNOSIS — E782 Mixed hyperlipidemia: Secondary | ICD-10-CM

## 2020-05-07 NOTE — Telephone Encounter (Addendum)
Left a voice message for the patient to give our office a call to discuss her results and new medication to be added to her regimen per Dr. Gwenlyn Found.  ----- Message from Lorretta Harp, MD sent at 05/06/2020  2:10 PM EDT ----- LDL better (81) but still not at goal for secondary prevention on high dose Crestor. Start Zetia 10 mg and re check FLP 2 months

## 2020-05-08 MED ORDER — EZETIMIBE 10 MG PO TABS
10.0000 mg | ORAL_TABLET | Freq: Every day | ORAL | 3 refills | Status: DC
Start: 2020-05-08 — End: 2020-07-10

## 2020-05-08 NOTE — Addendum Note (Signed)
Addended by: Patria Mane A on: 05/08/2020 09:41 AM   Modules accepted: Orders

## 2020-05-08 NOTE — Telephone Encounter (Signed)
Patient called back returning Terrah's call from yesterday. Please call to go over test results

## 2020-06-15 ENCOUNTER — Other Ambulatory Visit: Payer: Self-pay | Admitting: *Deleted

## 2020-06-15 ENCOUNTER — Encounter: Payer: Self-pay | Admitting: *Deleted

## 2020-06-15 DIAGNOSIS — E782 Mixed hyperlipidemia: Secondary | ICD-10-CM

## 2020-06-17 DIAGNOSIS — L918 Other hypertrophic disorders of the skin: Secondary | ICD-10-CM | POA: Diagnosis not present

## 2020-06-17 DIAGNOSIS — L570 Actinic keratosis: Secondary | ICD-10-CM | POA: Diagnosis not present

## 2020-06-23 ENCOUNTER — Ambulatory Visit (HOSPITAL_COMMUNITY)
Admission: RE | Admit: 2020-06-23 | Discharge: 2020-06-23 | Disposition: A | Discharge status: Home / Self Care | Payer: Medicare Other | Source: Ambulatory Visit | Attending: Pulmonary Disease | Admitting: Pulmonary Disease

## 2020-06-23 ENCOUNTER — Encounter: Payer: Self-pay | Admitting: Internal Medicine

## 2020-06-23 ENCOUNTER — Other Ambulatory Visit: Payer: Self-pay | Admitting: Physician Assistant

## 2020-06-23 DIAGNOSIS — I2510 Atherosclerotic heart disease of native coronary artery without angina pectoris: Secondary | ICD-10-CM

## 2020-06-23 DIAGNOSIS — Z23 Encounter for immunization: Secondary | ICD-10-CM | POA: Diagnosis not present

## 2020-06-23 DIAGNOSIS — U071 COVID-19: Secondary | ICD-10-CM

## 2020-06-23 DIAGNOSIS — I10 Essential (primary) hypertension: Secondary | ICD-10-CM

## 2020-06-23 DIAGNOSIS — I2583 Coronary atherosclerosis due to lipid rich plaque: Secondary | ICD-10-CM

## 2020-06-23 MED ORDER — ALBUTEROL SULFATE HFA 108 (90 BASE) MCG/ACT IN AERS: 2.0000 | INHALATION_SPRAY | Freq: Once | RESPIRATORY_TRACT | Status: DC | PRN

## 2020-06-23 MED ORDER — METHYLPREDNISOLONE SODIUM SUCC 125 MG IJ SOLR: 125.0000 mg | Freq: Once | INTRAMUSCULAR | Status: DC | PRN

## 2020-06-23 MED ORDER — SODIUM CHLORIDE 0.9 % IV SOLN: INTRAVENOUS | Status: DC | PRN

## 2020-06-23 MED ORDER — SODIUM CHLORIDE 0.9 % IV SOLN
1200.0000 mg | Freq: Once | INTRAVENOUS | Status: AC
  Administered 2020-06-23: 1200 mg via INTRAVENOUS
  Filled 2020-06-23: qty 10

## 2020-06-23 MED ORDER — EPINEPHRINE 0.3 MG/0.3ML IJ SOAJ: 0.3000 mg | Freq: Once | INTRAMUSCULAR | Status: DC | PRN

## 2020-06-23 MED ORDER — FAMOTIDINE IN NACL 20-0.9 MG/50ML-% IV SOLN: 20.0000 mg | Freq: Once | INTRAVENOUS | Status: DC | PRN

## 2020-06-23 MED ORDER — DIPHENHYDRAMINE HCL 50 MG/ML IJ SOLN: 50.0000 mg | Freq: Once | INTRAMUSCULAR | Status: DC | PRN

## 2020-06-23 NOTE — Progress Notes (Signed)
  Diagnosis: COVID-19  Physician: Dr. Joya Gaskins  Procedure: [facility] Med: casirivimab\imdevimab infusion - Provided patient with casirivimab\imdevimab fact sheet for patients, parents and caregivers prior to infusion.  Complications: No immediate complications noted.  Discharge: Discharged home   Tia Masker 06/23/2020

## 2020-06-23 NOTE — Discharge Instructions (Signed)

## 2020-06-23 NOTE — Progress Notes (Signed)
I connected by phone with Carolyn Martin on 06/23/2020 at 12:43 PM to discuss the potential use of a new treatment for mild to moderate COVID-19 viral infection in non-hospitalized patients.  This patient is a 69 y.o. female that meets the FDA criteria for Emergency Use Authorization of COVID monoclonal antibody casirivimab/imdevimab.  Has a (+) direct SARS-CoV-2 viral test result  Has mild or moderate COVID-19   Is NOT hospitalized due to COVID-19  Is within 10 days of symptom onset  Has at least one of the high risk factor(s) for progression to severe COVID-19 and/or hospitalization as defined in EUA.  Specific high risk criteria : Older age (>/= 68 yo) and Cardiovascular disease or hypertension   I have spoken and communicated the following to the patient or parent/caregiver regarding COVID monoclonal antibody treatment:  1. FDA has authorized the emergency use for the treatment of mild to moderate COVID-19 in adults and pediatric patients with positive results of direct SARS-CoV-2 viral testing who are 45 years of age and older weighing at least 40 kg, and who are at high risk for progressing to severe COVID-19 and/or hospitalization.  2. The significant known and potential risks and benefits of COVID monoclonal antibody, and the extent to which such potential risks and benefits are unknown.  3. Information on available alternative treatments and the risks and benefits of those alternatives, including clinical trials.  4. Patients treated with COVID monoclonal antibody should continue to self-isolate and use infection control measures (e.g., wear mask, isolate, social distance, avoid sharing personal items, clean and disinfect "high touch" surfaces, and frequent handwashing) according to CDC guidelines.   5. The patient or parent/caregiver has the option to accept or refuse COVID monoclonal antibody treatment.  After reviewing this information with the patient, The patient agreed to  proceed with receiving casirivimab\imdevimab infusion and will be provided a copy of the Fact sheet prior to receiving the infusion.   [name] 06/23/2020 12:43 PM

## 2020-07-02 DIAGNOSIS — E782 Mixed hyperlipidemia: Secondary | ICD-10-CM | POA: Diagnosis not present

## 2020-07-03 LAB — LIPID PANEL
Chol/HDL Ratio: 3.2 ratio (ref 0.0–4.4)
Cholesterol, Total: 151 mg/dL (ref 100–199)
HDL: 47 mg/dL (ref >39)
LDL Chol Calc (NIH): 85 mg/dL (ref 0–99)
Triglycerides: 101 mg/dL (ref 0–149)
VLDL Cholesterol Cal: 19 mg/dL (ref 5–40)

## 2020-07-07 ENCOUNTER — Other Ambulatory Visit: Payer: Self-pay

## 2020-07-07 DIAGNOSIS — I2510 Atherosclerotic heart disease of native coronary artery without angina pectoris: Secondary | ICD-10-CM

## 2020-07-07 DIAGNOSIS — I2583 Coronary atherosclerosis due to lipid rich plaque: Secondary | ICD-10-CM

## 2020-07-07 DIAGNOSIS — E782 Mixed hyperlipidemia: Secondary | ICD-10-CM

## 2020-07-07 MED ORDER — EZETIMIBE 10 MG PO TABS: 10.0000 mg | ORAL_TABLET | Freq: Every day | ORAL | 3 refills | Status: DC

## 2020-07-10 ENCOUNTER — Telehealth: Payer: Self-pay

## 2020-07-10 NOTE — Telephone Encounter (Signed)
Spoke to patient about recent email.Stated she is concerned since she has been taking Crestor 40 mg she has seen a decrease in her HDL.She would like to ask Dr.Berry if she needs to decrease back to 20 mg.Advised I will send message to [facility] for advice.

## 2020-07-10 NOTE — Telephone Encounter (Signed)
Spoke to patient Dr.Berry's advice given. 

## 2020-07-10 NOTE — Telephone Encounter (Signed)
Patient returning call.

## 2020-07-10 NOTE — Telephone Encounter (Signed)
Called patient left message on personal voice mail to call me back about recent email.

## 2020-07-10 NOTE — Addendum Note (Signed)
Addended by: Kathyrn Lass on: 07/10/2020 01:53 PM   Modules accepted: Orders

## 2020-07-10 NOTE — Telephone Encounter (Signed)
Great lipid profile. Would keep Crestor where it is

## 2020-07-16 ENCOUNTER — Ambulatory Visit (INDEPENDENT_AMBULATORY_CARE_PROVIDER_SITE_OTHER): Payer: Medicare Other | Admitting: Internal Medicine

## 2020-07-16 ENCOUNTER — Other Ambulatory Visit: Payer: Self-pay

## 2020-07-16 ENCOUNTER — Ambulatory Visit (INDEPENDENT_AMBULATORY_CARE_PROVIDER_SITE_OTHER): Payer: Medicare Other

## 2020-07-16 ENCOUNTER — Encounter: Payer: Self-pay | Admitting: Internal Medicine

## 2020-07-16 VITALS — Ht 62.5 in | Wt 130.0 lb

## 2020-07-16 VITALS — BP 142/76 | HR 72 | Temp 98.6°F | Ht 62.0 in

## 2020-07-16 DIAGNOSIS — Z8616 Personal history of COVID-19: Secondary | ICD-10-CM | POA: Diagnosis not present

## 2020-07-16 DIAGNOSIS — D72829 Elevated white blood cell count, unspecified: Secondary | ICD-10-CM | POA: Diagnosis not present

## 2020-07-16 DIAGNOSIS — I119 Hypertensive heart disease without heart failure: Secondary | ICD-10-CM | POA: Diagnosis not present

## 2020-07-16 DIAGNOSIS — Z0000 Encounter for general adult medical examination without abnormal findings: Secondary | ICD-10-CM

## 2020-07-16 DIAGNOSIS — R430 Anosmia: Secondary | ICD-10-CM

## 2020-07-16 DIAGNOSIS — I2510 Atherosclerotic heart disease of native coronary artery without angina pectoris: Secondary | ICD-10-CM

## 2020-07-16 DIAGNOSIS — E7800 Pure hypercholesterolemia, unspecified: Secondary | ICD-10-CM

## 2020-07-16 DIAGNOSIS — I2583 Coronary atherosclerosis due to lipid rich plaque: Secondary | ICD-10-CM | POA: Diagnosis not present

## 2020-07-16 DIAGNOSIS — Z2821 Immunization not carried out because of patient refusal: Secondary | ICD-10-CM | POA: Diagnosis not present

## 2020-07-16 NOTE — Progress Notes (Signed)
I connected with Terence Lux today by telephone and verified that I am speaking with the correct person using two identifiers. Location patient: home Location provider: work Persons participating in the virtual visit: Shaneal Barasch, Glenna Durand LPN.   I discussed the limitations, risks, security and privacy concerns of performing an evaluation and management service by telephone and the availability of in person appointments. I also discussed with the patient that there may be a patient responsible charge related to this service. The patient expressed understanding and verbally consented to this telephonic visit.    Interactive audio and video telecommunications were attempted between this provider and patient, however failed, due to patient having technical difficulties OR patient did not have access to video capability.  We continued and completed visit with audio only.     Vital signs may be patient reported or missing.  Subjective:   Carolyn Martin is a 69 y.o. female who presents for Medicare Annual (Subsequent) preventive examination.  Review of Systems     Cardiac Risk Factors include: advanced age (>37men, >46 women)     Objective:    Today's Vitals   07/16/20 0857  Weight: 130 lb (59 kg)  Height: 5' 2.5" (1.588 m)   Body mass index is 23.4 kg/m.  Advanced Directives 07/16/2020 03/07/2020 07/10/2019 11/24/2018 02/24/2018  Does Patient Have a Medical Advance Directive? Yes Yes Yes No No  Type of Paramedic of [place];Living will [place];Living will [place];Living will - -  Does patient want to make changes to medical advance directive? - No - Patient declined - - -  Copy of [place] in Chart? - - No - copy requested - -    Current Medications (verified) Outpatient Encounter Medications as of 07/16/2020  Medication Sig  . Ascorbic Acid (VITAMIN C PO) Take 500 mg by mouth daily.     . B Complex Vitamins (B COMPLEX 100 PO) Take 100 mg by mouth daily.  . Blood Pressure Monitoring (OMRON 5 SERIES BP MONITOR) DEVI   . Cholecalciferol (VITAMIN D-3 PO) Take 5,000 Units by mouth daily.   . clonazePAM (KLONOPIN) 0.5 MG tablet Take 1 tablet (0.5 mg total) by mouth daily as needed for anxiety.  . Coenzyme Q10 (CO Q 10 PO) Take 100 mg by mouth daily.   Marland Kitchen ezetimibe (ZETIA) 10 MG tablet Take 1 tablet (10 mg total) by mouth daily. (Patient not taking: Reported on 07/16/2020)  . rosuvastatin (CRESTOR) 40 MG tablet Take 1 tablet (40 mg total) by mouth daily.   No facility-administered encounter medications on file as of 07/16/2020.    Allergies (verified) Iodine and Sulfa antibiotics   History: Past Medical History:  Diagnosis Date  . Anginal pain ([place]) 05/14/2008   atypical chest pain-- Exercise  Myoview  EF 61% ,mod to severe ischemia Basal Anterior ,Anteroseptal,Mid Anterior,Mid Anteroseptal, Apical Anterior and Apical Septal regions   LV normal   . Coronary artery disease 06/23/2010   Echo EF =>55%,LV normal  . Hyperlipidemia   . Hypertension   . Sleep apnea   . Tremor, essential 02/19/2016   Past Surgical History:  Procedure Laterality Date  . CARDIAC CATHETERIZATION  05/15/2008   2 areas of LAD  . CORONARY ANGIOPLASTY WITH STENT PLACEMENT  05/15/2008    proximal LAD -MiniVision stent 2.5 x 12 and mid LAD 2 overlapping MiniVision 2.0 x 15   Family History  Problem Relation Age of Onset  . Diabetes Mother   .  Hypertension Mother   . Kidney failure Mother   . Transient ischemic attack Father   . Coronary artery disease Father   . Dementia Father    Social History   Socioeconomic History  . Marital status: Married    Spouse name: Hendricks Milo  . Number of children: 2  . Years of education: 16  . Highest education level: Not on file  Occupational History  . Occupation: Retired    Fish farm manager: [employer]  Tobacco Use  . Smoking status: Former Smoker    Types:  Cigarettes    Quit date: 12/17/2006    Years since quitting: 13.5  . Smokeless tobacco: Never Used  Vaping Use  . Vaping Use: Never used  Substance and Sexual Activity  . Alcohol use: No    Alcohol/week: 0.0 standard drinks  . Drug use: No  . Sexual activity: Yes  Other Topics Concern  . Not on file  Social History Narrative   Lives w/ husband   Right-handed   Drinks about 12 oz caffeinated beverage per day   Social Determinants of Health   Financial Resource Strain: Low Risk   . Difficulty of Paying Living Expenses: Not hard at all  Food Insecurity: No Food Insecurity  . Worried About Charity fundraiser in the Last Year: Never true  . Ran Out of Food in the Last Year: Never true  Transportation Needs: No Transportation Needs  . Lack of Transportation (Medical): No  . Lack of Transportation (Non-Medical): No  Physical Activity: Sufficiently Active  . Days of Exercise per Week: 5 days  . Minutes of Exercise per Session: 30 min  Stress: No Stress Concern Present  . Feeling of Stress : Not at all  Social Connections:   . Frequency of Communication with Friends and Family: Not on file  . Frequency of Social Gatherings with Friends and Family: Not on file  . Attends Religious Services: Not on file  . Active Member of Clubs or Organizations: Not on file  . Attends Archivist Meetings: Not on file  . Marital Status: Not on file    Tobacco Counseling Counseling given: Not Answered   Clinical Intake:  Pre-visit preparation completed: Yes  Pain : No/denies pain     Nutritional Status: BMI of 19-24  Normal Nutritional Risks: None Diabetes: No  How often do you need to have someone help you when you read instructions, pamphlets, or other written materials from your doctor or pharmacy?: 1 - Never What is the last grade level you completed in school?: 12th grade  Diabetic? no  Interpreter Needed?: No  Information entered by :: NAllen LPN   Activities of  Daily Living In your present state of health, do you have any difficulty performing the following activities: 07/16/2020 07/16/2020  Hearing? N N  Vision? N N  Difficulty concentrating or making decisions? N N  Walking or climbing stairs? N N  Dressing or bathing? N N  Doing errands, shopping? N N  Preparing Food and eating ? N -  Using the Toilet? N -  In the past six months, have you accidently leaked urine? N -  Do you have problems with loss of bowel control? N -  Managing your Medications? N -  Managing your Finances? N -  Housekeeping or managing your Housekeeping? N -  Some recent data might be hidden    Patient Care Team: Glendale Chard, MD as PCP - General (Internal Medicine)  Indicate any recent Medical  Services you may have received from other than [facility] providers in the past year (date may be approximate).     Assessment:   This is a routine wellness examination for [place].  Hearing/Vision screen  Hearing Screening   125Hz  250Hz  500Hz  1000Hz  2000Hz  3000Hz  4000Hz  6000Hz  8000Hz   Right ear:           Left ear:           Vision Screening Comments: Regular eye exams, Dr. Wyatt Portela  Dietary issues and exercise activities discussed: Current Exercise Habits: Home exercise routine, Type of exercise: walking, Time (Minutes): 30, Frequency (Times/Week): 5, Weekly Exercise (Minutes/Week): 150  Goals    . Patient Stated     07/10/2019, wants to stay healthy    . Patient Stated     07/16/2020, no goals      Depression Screen PHQ 2/9 Scores 07/16/2020 07/16/2020 07/10/2019 01/16/2019 11/28/2018 07/18/2018  PHQ - 2 Score 0 0 0 0 0 0  PHQ- 9 Score - - 0 - - -    Fall Risk Fall Risk  07/16/2020 07/16/2020 07/10/2019 01/16/2019 11/28/2018  Falls in the past year? 0 0 0 0 1  Comment - - - - -  Number falls in past yr: - - 0 - 0  Comment - - - - -  Injury with Fall? - - - - 1  Risk for fall due to : Medication side effect - Medication side effect - -  Follow up Falls evaluation  completed;Education provided;Falls prevention discussed - Falls evaluation completed;Education provided;Falls prevention discussed - -    Any stairs in or around the home? Yes  If so, are there any without handrails? No  Home free of loose throw rugs in walkways, pet beds, electrical cords, etc? Yes  Adequate lighting in your home to reduce risk of falls? Yes   ASSISTIVE DEVICES UTILIZED TO PREVENT FALLS:  Life alert? No  Use of a cane, walker or w/c? No  Grab bars in the bathroom? Yes  Shower chair or bench in shower? No  Elevated toilet seat or a handicapped toilet? No   TIMED UP AND GO:  Was the test performed? No .      Cognitive Function:     6CIT Screen 07/16/2020 07/10/2019  What Year? 0 points 0 points  What month? 0 points 0 points  What time? 0 points 0 points  Count back from 20 0 points 0 points  Months in reverse 0 points 0 points  Repeat phrase 0 points 0 points  Total Score 0 0    Immunizations Immunization History  Administered Date(s) Administered  . Pneumococcal Polysaccharide-23 07/19/2017  . Tdap 02/24/2018    TDAP status: Up to date Flu Vaccine status: Declined, Education has been provided regarding the importance of this vaccine but patient still declined. Advised may receive this vaccine at local pharmacy or Health Dept. Aware to provide a copy of the vaccination record if obtained from local pharmacy or Health Dept. Verbalized acceptance and understanding. Pneumococcal vaccine status: Up to date Covid-19 vaccine status: Declined, Education has been provided regarding the importance of this vaccine but patient still declined. Advised may receive this vaccine at local pharmacy or Health Dept.or vaccine clinic. Aware to provide a copy of the vaccination record if obtained from local pharmacy or Health Dept. Verbalized acceptance and understanding.  Qualifies for Shingles Vaccine? Yes   Zostavax completed No   Shingrix Completed?: No.    Education  has been provided regarding  the importance of this vaccine. Patient has been advised to call insurance company to determine out of pocket expense if they have not yet received this vaccine. Advised may also receive vaccine at local pharmacy or Health Dept. Verbalized acceptance and understanding.  Screening Tests Health Maintenance  Topic Date Due  . COVID-19 Vaccine (1) Never done  . PNA vac Low Risk Adult (2 of 2 - PCV13) 07/19/2018  . INFLUENZA VACCINE  01/14/2021 (Originally 05/17/2020)  . MAMMOGRAM  07/14/2021  . TETANUS/TDAP  02/25/2028  . COLONOSCOPY  05/30/2028  . DEXA SCAN  Completed  . Hepatitis C Screening  Completed    Health Maintenance  Health Maintenance Due  Topic Date Due  . COVID-19 Vaccine (1) Never done  . PNA vac Low Risk Adult (2 of 2 - PCV13) 07/19/2018    Colorectal cancer screening: Completed 05/30/2018. Repeat every 5 years Mammogram status: Completed 07/15/2019. Repeat every year Bone Density status: Completed 06/02/2017.  Lung Cancer Screening: (Low Dose CT Chest recommended if Age 56-80 years, 30 pack-year currently smoking OR have quit w/in 15years.) does not qualify.   If pt is not established with a provider, would they like to be referred to a provider to establish care? No .   Dental Screening: Recommended annual dental exams for proper oral hygiene  Community Resource Referral / Chronic Care Management: CRR required this visit?  No   CCM required this visit?  No      Plan:     I have personally reviewed and noted the following in the patient's chart:   . Medical and social history . Use of alcohol, tobacco or illicit drugs  . Current medications and supplements . Functional ability and status . Nutritional status . Physical activity . Advanced directives . List of other physicians . Hospitalizations, surgeries, and ER visits in previous 12 months . Vitals . Screenings to include cognitive, depression, and falls . Referrals and  appointments  In addition, I have reviewed and discussed with patient certain preventive protocols, quality metrics, and best practice recommendations. A written personalized care plan for preventive services as well as general preventive health recommendations were provided to patient.     Kellie Simmering, LPN   [id number]   Nurse Notes:

## 2020-07-16 NOTE — Patient Instructions (Signed)
[name] , Thank you for taking time to come for your Medicare Wellness Visit. I appreciate your ongoing commitment to your health goals. Please review the following plan we discussed and let me know if I can assist you in the future.   Screening recommendations/referrals: Colonoscopy: completed 05/30/2018 Mammogram: patient to schedule Bone Density: completed 06/02/2017 Recommended yearly ophthalmology/optometry visit for glaucoma screening and checkup Recommended yearly dental visit for hygiene and checkup  Vaccinations: Influenza vaccine: decline Pneumococcal vaccine: completed 07/19/2017 Tdap vaccine: completed 02/24/2018 Shingles vaccine: discussed   Covid-19: decline  Advanced directives: Please bring a copy of your POA (Power of Attorney) and/or Living Will to your next appointment.   Conditions/risks identified: none  Next appointment: Follow up in one year for your annual wellness visit    Preventive Care 65 Years and Older, Female Preventive care refers to lifestyle choices and visits with your health care provider that can promote health and wellness. What does preventive care include?  A yearly physical exam. This is also called an annual well check.  Dental exams once or twice a year.  Routine eye exams. Ask your health care provider how often you should have your eyes checked.  Personal lifestyle choices, including:  Daily care of your teeth and gums.  Regular physical activity.  Eating a healthy diet.  Avoiding tobacco and drug use.  Limiting alcohol use.  Practicing safe sex.  Taking low-dose aspirin every day.  Taking vitamin and mineral supplements as recommended by your health care provider. What happens during an annual well check? The services and screenings done by your health care provider during your annual well check will depend on your age, overall health, lifestyle risk factors, and family history of disease. Counseling  Your health care  provider may ask you questions about your:  Alcohol use.  Tobacco use.  Drug use.  Emotional well-being.  Home and relationship well-being.  Sexual activity.  Eating habits.  History of falls.  Memory and ability to understand (cognition).  Work and work Statistician.  Reproductive health. Screening  You may have the following tests or measurements:  Height, weight, and BMI.  Blood pressure.  Lipid and cholesterol levels. These may be checked every 5 years, or more frequently if you are over 53 years old.  Skin check.  Lung cancer screening. You may have this screening every year starting at age 22 if you have a 30-pack-year history of smoking and currently smoke or have quit within the past 15 years.  Fecal occult blood test (FOBT) of the stool. You may have this test every year starting at age 10.  Flexible sigmoidoscopy or colonoscopy. You may have a sigmoidoscopy every 5 years or a colonoscopy every 10 years starting at age 15.  Hepatitis C blood test.  Hepatitis B blood test.  Sexually transmitted disease (STD) testing.  Diabetes screening. This is done by checking your blood sugar (glucose) after you have not eaten for a while (fasting). You may have this done every 1-3 years.  Bone density scan. This is done to screen for osteoporosis. You may have this done starting at age 35.  Mammogram. This may be done every 1-2 years. Talk to your health care provider about how often you should have regular mammograms. Talk with your health care provider about your test results, treatment options, and if necessary, the need for more tests. Vaccines  Your health care provider may recommend certain vaccines, such as:  Influenza vaccine. This is recommended every year.  Tetanus, diphtheria, and acellular pertussis (Tdap, Td) vaccine. You may need a Td booster every 10 years.  Zoster vaccine. You may need this after age 54.  Pneumococcal 13-valent conjugate (PCV13)  vaccine. One dose is recommended after age 69.  Pneumococcal polysaccharide (PPSV23) vaccine. One dose is recommended after age 66. Talk to your health care provider about which screenings and vaccines you need and how often you need them. This information is not intended to replace advice given to you by your health care provider. Make sure you discuss any questions you have with your health care provider. Document Released: 10/30/2015 Document Revised: 06/22/2016 Document Reviewed: 08/04/2015 Elsevier Interactive Patient Education  2017 [place] Prevention in the Home Falls can cause injuries. They can happen to people of all ages. There are many things you can do to make your home safe and to help prevent falls. What can I do on the outside of my home?  Regularly fix the edges of walkways and driveways and fix any cracks.  Remove anything that might make you trip as you walk through a door, such as a raised step or threshold.  Trim any bushes or trees on the path to your home.  Use bright outdoor lighting.  Clear any walking paths of anything that might make someone trip, such as rocks or tools.  Regularly check to see if handrails are loose or broken. Make sure that both sides of any steps have handrails.  Any raised decks and porches should have guardrails on the edges.  Have any leaves, snow, or ice cleared regularly.  Use sand or salt on walking paths during winter.  Clean up any spills in your garage right away. This includes oil or grease spills. What can I do in the bathroom?  Use night lights.  Install grab bars by the toilet and in the tub and shower. Do not use towel bars as grab bars.  Use non-skid mats or decals in the tub or shower.  If you need to sit down in the shower, use a plastic, non-slip stool.  Keep the floor dry. Clean up any water that spills on the floor as soon as it happens.  Remove soap buildup in the tub or shower  regularly.  Attach bath mats securely with double-sided non-slip rug tape.  Do not have throw rugs and other things on the floor that can make you trip. What can I do in the bedroom?  Use night lights.  Make sure that you have a light by your bed that is easy to reach.  Do not use any sheets or blankets that are too big for your bed. They should not hang down onto the floor.  Have a firm chair that has side arms. You can use this for support while you get dressed.  Do not have throw rugs and other things on the floor that can make you trip. What can I do in the kitchen?  Clean up any spills right away.  Avoid walking on wet floors.  Keep items that you use a lot in easy-to-reach places.  If you need to reach something above you, use a strong step stool that has a grab bar.  Keep electrical cords out of the way.  Do not use floor polish or wax that makes floors slippery. If you must use wax, use non-skid floor wax.  Do not have throw rugs and other things on the floor that can make you trip. What can I do  with my stairs?  Do not leave any items on the stairs.  Make sure that there are handrails on both sides of the stairs and use them. Fix handrails that are broken or loose. Make sure that handrails are as long as the stairways.  Check any carpeting to make sure that it is firmly attached to the stairs. Fix any carpet that is loose or worn.  Avoid having throw rugs at the top or bottom of the stairs. If you do have throw rugs, attach them to the floor with carpet tape.  Make sure that you have a light switch at the top of the stairs and the bottom of the stairs. If you do not have them, ask someone to add them for you. What else can I do to help prevent falls?  Wear shoes that:  Do not have high heels.  Have rubber bottoms.  Are comfortable and fit you well.  Are closed at the toe. Do not wear sandals.  If you use a stepladder:  Make sure that it is fully  opened. Do not climb a closed stepladder.  Make sure that both sides of the stepladder are locked into place.  Ask someone to hold it for you, if possible.  Clearly mark and make sure that you can see:  Any grab bars or handrails.  First and last steps.  Where the edge of each step is.  Use tools that help you move around (mobility aids) if they are needed. These include:  Canes.  Walkers.  Scooters.  Crutches.  Turn on the lights when you go into a dark area. Replace any light bulbs as soon as they burn out.  Set up your furniture so you have a clear path. Avoid moving your furniture around.  If any of your floors are uneven, fix them.  If there are any pets around you, be aware of where they are.  Review your medicines with your doctor. Some medicines can make you feel dizzy. This can increase your chance of falling. Ask your doctor what other things that you can do to help prevent falls. This information is not intended to replace advice given to you by your health care provider. Make sure you discuss any questions you have with your health care provider. Document Released: 07/30/2009 Document Revised: 03/10/2016 Document Reviewed: 11/07/2014 Elsevier Interactive Patient Education  2017 [employer].

## 2020-07-16 NOTE — Patient Instructions (Signed)
Leukocytosis Leukocytosis means that a person has more white blood cells than normal. White blood cells are made in the bone marrow. Bone marrow is the spongy tissue inside bones. The main job of white blood cells is to fight infection. Having too many white blood cells is a common condition. It can develop as a result of many types of medical problems. What are the causes? Leukocytosis may be caused by various conditions. In some cases, the bone marrow is normal but is still making too many white blood cells. This can be due to:  Infection.  Injury.  Physical stress.  Emotional stress.  Surgery.  Allergic reactions.  Tumors that do not start in the blood or bone marrow.  An inherited disease.  Certain medicines.  Pregnancy and labor. In other cases, a person may have a bone marrow disorder that is causing the body to make too many white blood cells. Bone marrow disorders include:  Leukemia. This is a type of blood cancer.  Myeloproliferative disorders. These disorders cause blood cells to grow abnormally. What are the signs or symptoms? Often, this condition causes no symptoms. Some people may have symptoms due to the medical condition that is causing their leukocytosis. These symptoms may include:  Bleeding.  Bruising.  Fever.  Night sweats.  Swollen lymph nodes.  An enlarged spleen.  Repeated infections.  Weakness.  Weight loss. How is this diagnosed? This condition is diagnosed with blood tests. It is often found when blood is tested as part of a routine physical exam. You may have other tests to help determine why you have too many white blood cells. These tests may include:  A complete blood count (CBC). This test measures all the types of blood cells in your body.  Chest X-rays, urine tests, or other tests to look for signs of infection.  Bone marrow aspiration. For this test, a needle is put into your bone. Cells from the bone marrow are removed  through the needle and examined under a microscope.  Other tests on the blood or bone marrow sample.  CT scan, bone scan, or other imaging tests. How is this treated? Usually, treatment is not needed for leukocytosis. However, if an infection, cancer, bone marrow disorder, or other serious problem is causing your leukocytosis, it will need to be treated. Treatment may include:  Regular monitoring of your white blood cell count to look for changes.  Antibiotic medicine if you have a bacterial infection.  Bone marrow transplant. This treatment replaces your diseased bone marrow with healthy cells that will grow new bone marrow.  Chemotherapy or biological therapies such as the use of antibodies. These treatments may be used to kill cancer cells or to decrease the number of white blood cells. Follow these instructions at home: Medicines  Take over-the-counter and prescription medicines only as told by your health care provider.  If you were prescribed an antibiotic medicine, take it as told by your health care provider. Do not stop taking the antibiotic even if you start to feel better. Eating and drinking   Eat foods that are low in saturated fats and high in fiber. Eat plenty of fruits and vegetables.  Drink enough fluid to keep your urine pale yellow.  Limit your intake of caffeine and alcohol. General instructions  Maintain a healthy weight. Ask your health care provider what weight is best for you.  Do 30 minutes of exercise at least 5 times each week. Check with your health care provider before you   start a new exercise routine.  Follow any safety precautions as told by your health care provider. This may be needed if you are at increased risk for infection or bleeding because of your condition.  Do not use any products that contain nicotine or tobacco, such as cigarettes, e-cigarettes, and chewing tobacco. If you need help quitting, ask your health care provider.  Keep all  follow-up visits as told by your health care provider. This is important. Contact a health care provider if you:  Feel weak or more tired than usual.  Develop chills, a cough, or nasal congestion.  Have a fever.  Lose weight without trying.  Have night sweats.  Bruise easily.  Have new or worsening symptoms. Get help right away if you:  Bleed more than normal.  Have chest pain.  Have trouble breathing.  Have uncontrolled nausea or vomiting.  Feel dizzy or light-headed. Summary  Leukocytosis means that a person has more white blood cells than normal.  This condition often causes no symptoms.  This condition may be caused by various conditions.  If an infection, cancer, bone marrow disorder, or other serious problem is causing your leukocytosis, it will need to be treated.  Keep all follow-up visits as told by your health care provider. This is important. This information is not intended to replace advice given to you by your health care provider. Make sure you discuss any questions you have with your health care provider. Document Revised: 06/28/2018 Document Reviewed: 06/28/2018 Elsevier Patient Education  2020 Elsevier Inc.  

## 2020-07-16 NOTE — Progress Notes (Signed)
Rutherford Nail as a scribe for [name], MD.,have documented all relevant documentation on the behalf of [name], MD,as directed by  [name], MD while in the presence of [name], MD. This visit occurred during the SARS-CoV-2 public health emergency.  Safety protocols were in place, including screening questions prior to the visit, additional usage of staff PPE, and extensive cleaning of exam room while observing appropriate contact time as indicated for disinfecting solutions.  Subjective:     Patient ID: Carolyn Martin , female    DOB: Sep 27, 1951 , 69 y.o.   MRN: [id number]   Chief Complaint  Patient presents with  . Hypertension    HPI  Pt is here for a follow up on a b/p check. Reports compliance with meds. She was recently diagnosed with COVID on 06/21/20.  She is still having trouble smelling and tasting. . She reports she caught COVID from her granddaughter. She received antibody treatment on 9/7. She initially had headache, low-grade fever, congestion and lack of taste/smell. She does not wish to get the COVID vaccine.   Hypertension This is a chronic problem. The current episode started more than 1 year ago. The problem has been gradually improving since onset. The problem is controlled. Pertinent negatives include no blurred vision, chest pain, palpitations or shortness of breath. Risk factors for coronary artery disease include post-menopausal state and dyslipidemia. Past treatments include lifestyle changes. The current treatment provides moderate improvement. There are no compliance problems.  Hypertensive end-organ damage includes CAD/MI.  Hyperlipidemia This is a chronic problem. The problem is controlled. Pertinent negatives include no chest pain or shortness of breath. Risk factors for coronary artery disease include dyslipidemia and post-menopausal.     Past Medical History:  Diagnosis Date  . Anginal pain ([place]) 05/14/2008   atypical  chest pain-- Exercise  Myoview  EF 61% ,mod to severe ischemia Basal Anterior ,Anteroseptal,Mid Anterior,Mid Anteroseptal, Apical Anterior and Apical Septal regions   LV normal   . Coronary artery disease 06/23/2010   Echo EF =>55%,LV normal  . Hyperlipidemia   . Hypertension   . Sleep apnea   . Tremor, essential 02/19/2016     Family History  Problem Relation Age of Onset  . Diabetes Mother   . Hypertension Mother   . Kidney failure Mother   . Transient ischemic attack Father   . Coronary artery disease Father   . Dementia Father   . Cancer Brother      Current Outpatient Medications:  .  Acetylcarnitine HCl (ACETYL-L-CARNITINE HCL) POWD, by Does not apply route., Disp: , Rfl:  .  Ascorbic Acid (VITAMIN C PO), Take 500 mg by mouth daily. , Disp: , Rfl:  .  B Complex Vitamins (B COMPLEX 100 PO), Take 100 mg by mouth daily., Disp: , Rfl:  .  Blood Pressure Monitoring (OMRON 5 SERIES BP MONITOR) DEVI, , Disp: , Rfl:  .  Cholecalciferol (VITAMIN D-3 PO), Take 5,000 Units by mouth daily. , Disp: , Rfl:  .  clonazePAM (KLONOPIN) 0.5 MG tablet, Take 1 tablet (0.5 mg total) by mouth daily as needed for anxiety., Disp: 30 tablet, Rfl: 1 .  Coenzyme Q10 (CO Q 10 PO), Take 100 mg by mouth daily. , Disp: , Rfl:  .  rosuvastatin (CRESTOR) 40 MG tablet, Take 1 tablet (40 mg total) by mouth daily., Disp: 90 tablet, Rfl: 3 .  ezetimibe (ZETIA) 10 MG tablet, Take 1 tablet (10 mg total) by mouth  daily., Disp: 90 tablet, Rfl: 3   Allergies  Allergen Reactions  . Iodine Hives  . Sulfa Antibiotics      Review of Systems  Constitutional: Negative.   Eyes: Negative for blurred vision.  Respiratory: Negative.  Negative for shortness of breath.   Cardiovascular: Negative.  Negative for chest pain and palpitations.  Gastrointestinal: Negative.   Musculoskeletal: Negative.   Skin: Negative.   Neurological: Negative.   Psychiatric/Behavioral: Negative.      Today's Vitals   07/16/20 0856  BP:  (!) 142/76  Pulse: 72  Temp: 98.6 F (37 C)  TempSrc: Oral  Height: _0  (1.575 m)   Body mass index is 24.51 kg/m.   Objective:  Physical Exam Vitals and nursing note reviewed.  Constitutional:      Appearance: Normal appearance.  HENT:     Head: Normocephalic and atraumatic.  Cardiovascular:     Rate and Rhythm: Normal rate and regular rhythm.     Heart sounds: Normal heart sounds.  Pulmonary:     Effort: Pulmonary effort is normal.     Breath sounds: Normal breath sounds.  Skin:    General: Skin is warm.  Neurological:     General: No focal deficit present.     Mental Status: She is alert.  Psychiatric:        Mood and Affect: Mood normal.        Behavior: Behavior normal.         Assessment And Plan:     1. Benign hypertensive heart disease without heart failure Comments: Chronic, fair control.   She will c/w current meds. I will check renal function today.  - CMP14+EGFR  2. Coronary artery disease due to lipid rich plaque Comments: Chronic, yet stable. She does not have any anginal symptoms. Encouraged to follow a heart healthy lifestyle.   3. Pure hypercholesterolemia Comments: Chronic, encouraged to avoid fried foods, take statin as prescribed and exercise no less than 150 minutes per week.  4. Anosmia Comments: Persistent. Advised that it is difficult to determine how long her sx will last.   5. Leukocytosis, unspecified type Comments: She reports her Mother had CLL. She wants to see specialist for further evaluation. I will place referral to Hematology.  - CBC with Diff  6. Personal history of covid-19  7. COVID-19 virus vaccination declined     Patient was given opportunity to ask questions. Patient verbalized understanding of the plan and was able to repeat key elements of the plan. All questions were answered to their satisfaction.  [name], MD   I, [name], MD, have reviewed all documentation for this visit. The  documentation on 07/31/20 for the exam, diagnosis, procedures, and orders are all accurate and complete.  THE PATIENT IS ENCOURAGED TO PRACTICE SOCIAL DISTANCING DUE TO THE COVID-19 PANDEMIC.

## 2020-07-17 LAB — CBC WITH DIFFERENTIAL/PLATELET
Basophils Absolute: 0 10*3/uL (ref 0.0–0.2)
Basos: 0 %
EOS (ABSOLUTE): 0.1 10*3/uL (ref 0.0–0.4)
Eos: 1 %
Hematocrit: 40.6 % (ref 34.0–46.6)
Hemoglobin: 13.6 g/dL (ref 11.1–15.9)
Immature Grans (Abs): 0 10*3/uL (ref 0.0–0.1)
Immature Granulocytes: 0 %
Lymphocytes Absolute: 6.7 10*3/uL — ABNORMAL HIGH (ref 0.7–3.1)
Lymphs: 70 %
MCH: 31.6 pg (ref 26.6–33.0)
MCHC: 33.5 g/dL (ref 31.5–35.7)
MCV: 94 fL (ref 79–97)
Monocytes Absolute: 0.4 10*3/uL (ref 0.1–0.9)
Monocytes: 4 %
Neutrophils Absolute: 2.4 10*3/uL (ref 1.4–7.0)
Neutrophils: 25 %
Platelets: 233 10*3/uL (ref 150–450)
RBC: 4.31 x10E6/uL (ref 3.77–5.28)
RDW: 12.1 % (ref 11.7–15.4)
WBC: 9.6 10*3/uL (ref 3.4–10.8)

## 2020-07-17 LAB — CMP14+EGFR
ALT: 22 IU/L (ref 0–32)
AST: 20 IU/L (ref 0–40)
Albumin/Globulin Ratio: 2.2 (ref 1.2–2.2)
Albumin: 4.7 g/dL (ref 3.8–4.8)
Alkaline Phosphatase: 74 IU/L (ref 44–121)
BUN/Creatinine Ratio: 21 (ref 12–28)
BUN: 15 mg/dL (ref 8–27)
Bilirubin Total: 0.5 mg/dL (ref 0.0–1.2)
CO2: 25 mmol/L (ref 20–29)
Calcium: 9.5 mg/dL (ref 8.7–10.3)
Chloride: 106 mmol/L (ref 96–106)
Creatinine, Ser: 0.7 mg/dL (ref 0.57–1.00)
GFR calc Af Amer: 103 mL/min/{1.73_m2} (ref >59)
GFR calc non Af Amer: 89 mL/min/{1.73_m2} (ref >59)
Globulin, Total: 2.1 g/dL (ref 1.5–4.5)
Glucose: 82 mg/dL (ref 65–99)
Potassium: 4.2 mmol/L (ref 3.5–5.2)
Sodium: 143 mmol/L (ref 134–144)
Total Protein: 6.8 g/dL (ref 6.0–8.5)

## 2020-07-20 ENCOUNTER — Encounter: Payer: Self-pay | Admitting: Internal Medicine

## 2020-07-21 ENCOUNTER — Other Ambulatory Visit: Payer: Self-pay | Admitting: Internal Medicine

## 2020-07-21 DIAGNOSIS — D72829 Elevated white blood cell count, unspecified: Secondary | ICD-10-CM

## 2020-07-27 ENCOUNTER — Other Ambulatory Visit: Payer: Self-pay

## 2020-07-27 ENCOUNTER — Inpatient Hospital Stay: Payer: Medicare Other

## 2020-07-27 ENCOUNTER — Encounter: Payer: Self-pay | Admitting: Oncology

## 2020-07-27 ENCOUNTER — Inpatient Hospital Stay: Payer: Medicare Other | Attending: Oncology | Admitting: Oncology

## 2020-07-27 VITALS — BP 114/81 | HR 91 | Temp 98.2°F | Resp 16 | Ht 62.5 in | Wt 129.4 lb

## 2020-07-27 DIAGNOSIS — Z79899 Other long term (current) drug therapy: Secondary | ICD-10-CM | POA: Diagnosis not present

## 2020-07-27 DIAGNOSIS — Z8249 Family history of ischemic heart disease and other diseases of the circulatory system: Secondary | ICD-10-CM | POA: Diagnosis not present

## 2020-07-27 DIAGNOSIS — D72820 Lymphocytosis (symptomatic): Secondary | ICD-10-CM | POA: Insufficient documentation

## 2020-07-27 DIAGNOSIS — I2510 Atherosclerotic heart disease of native coronary artery without angina pectoris: Secondary | ICD-10-CM | POA: Diagnosis not present

## 2020-07-27 DIAGNOSIS — G4730 Sleep apnea, unspecified: Secondary | ICD-10-CM | POA: Insufficient documentation

## 2020-07-27 DIAGNOSIS — D472 Monoclonal gammopathy: Secondary | ICD-10-CM | POA: Insufficient documentation

## 2020-07-27 DIAGNOSIS — Z87891 Personal history of nicotine dependence: Secondary | ICD-10-CM | POA: Diagnosis not present

## 2020-07-27 DIAGNOSIS — D72829 Elevated white blood cell count, unspecified: Secondary | ICD-10-CM | POA: Diagnosis not present

## 2020-07-27 DIAGNOSIS — I10 Essential (primary) hypertension: Secondary | ICD-10-CM | POA: Insufficient documentation

## 2020-07-27 DIAGNOSIS — E785 Hyperlipidemia, unspecified: Secondary | ICD-10-CM | POA: Insufficient documentation

## 2020-07-27 DIAGNOSIS — Z833 Family history of diabetes mellitus: Secondary | ICD-10-CM | POA: Insufficient documentation

## 2020-07-27 DIAGNOSIS — Z8616 Personal history of COVID-19: Secondary | ICD-10-CM | POA: Insufficient documentation

## 2020-07-27 LAB — HEPATITIS PANEL, ACUTE
HCV Ab: NONREACTIVE
Hep A IgM: NONREACTIVE
Hep B C IgM: NONREACTIVE
Hepatitis B Surface Ag: NONREACTIVE

## 2020-07-27 LAB — CBC WITH DIFFERENTIAL/PLATELET
Abs Immature Granulocytes: 0.03 10*3/uL (ref 0.00–0.07)
Basophils Absolute: 0.1 10*3/uL (ref 0.0–0.1)
Basophils Relative: 1 %
Eosinophils Absolute: 0.2 10*3/uL (ref 0.0–0.5)
Eosinophils Relative: 1 %
HCT: 40.4 % (ref 36.0–46.0)
Hemoglobin: 13.6 g/dL (ref 12.0–15.0)
Immature Granulocytes: 0 %
Lymphocytes Relative: 57 %
Lymphs Abs: 7.2 10*3/uL — ABNORMAL HIGH (ref 0.7–4.0)
MCH: 31.6 pg (ref 26.0–34.0)
MCHC: 33.7 g/dL (ref 30.0–36.0)
MCV: 94 fL (ref 80.0–100.0)
Monocytes Absolute: 0.9 10*3/uL (ref 0.1–1.0)
Monocytes Relative: 7 %
Neutro Abs: 4.2 10*3/uL (ref 1.7–7.7)
Neutrophils Relative %: 34 %
Platelets: 192 10*3/uL (ref 150–400)
RBC: 4.3 MIL/uL (ref 3.87–5.11)
RDW: 12.7 % (ref 11.5–15.5)
WBC: 12.6 10*3/uL — ABNORMAL HIGH (ref 4.0–10.5)
nRBC: 0 % (ref 0.0–0.2)

## 2020-07-27 LAB — HIV ANTIBODY (ROUTINE TESTING W REFLEX): HIV Screen 4th Generation wRfx: NONREACTIVE

## 2020-07-27 LAB — LACTATE DEHYDROGENASE: LDH: 113 U/L (ref 98–192)

## 2020-07-27 NOTE — Progress Notes (Signed)
Hematology/Oncology Consult note [facility] Telephone:([phone] [phone]   Patient Care Team: Glendale Chard, MD as PCP - General (Internal Medicine)  REFERRING PROVIDER: Glendale Chard, MD  CHIEF COMPLAINTS/REASON FOR VISIT:  Evaluation of leukocytosis  HISTORY OF PRESENTING ILLNESS:  Carolyn Martin is a  69 y.o.  female with PMH listed below who was referred to me for evaluation of leukocytosis Reviewed patient' recent labs obtained by PCP.   07/16/2020 CBC showed elevated white count of 9.6, lymphocyte 6.7. Previous lab records reviewed. Leukocytosis onset of chronic, duration is since at least September 2020.  No aggravating or elevated factors. Associated symptoms or signs:  Denies weight loss, fever, chills,night sweats.  Denies fatigue Smoking history: She is a former smoker.  She quit smoking 2008. History of recent oral steroid use or steroid injection: Denies History of recent infection: Patient had recent COVID-19 infection in September 2021.  Patient got Mab infusion. Autoimmune disease history.  Denies  She reports that her mom has a history of CLL  Review of Systems  Constitutional: Negative for appetite change, chills, fatigue and fever.  HENT:   Negative for hearing loss and voice change.   Eyes: Negative for eye problems.  Respiratory: Negative for chest tightness and cough.   Cardiovascular: Negative for chest pain.  Gastrointestinal: Negative for abdominal distention, abdominal pain and blood in stool.  Endocrine: Negative for hot flashes.  Genitourinary: Negative for difficulty urinating and frequency.   Musculoskeletal: Negative for arthralgias.  Skin: Negative for itching and rash.  Neurological: Negative for extremity weakness.  Hematological: Negative for adenopathy.  Psychiatric/Behavioral: Negative for confusion.     MEDICAL HISTORY:  Past Medical History:  Diagnosis Date  . Anginal pain ([place])  05/14/2008   atypical chest pain-- Exercise  Myoview  EF 61% ,mod to severe ischemia Basal Anterior ,Anteroseptal,Mid Anterior,Mid Anteroseptal, Apical Anterior and Apical Septal regions   LV normal   . Coronary artery disease 06/23/2010   Echo EF =>55%,LV normal  . Hyperlipidemia   . Hypertension   . Sleep apnea   . Tremor, essential 02/19/2016    SURGICAL HISTORY: Past Surgical History:  Procedure Laterality Date  . CARDIAC CATHETERIZATION  05/15/2008   2 areas of LAD  . CORONARY ANGIOPLASTY WITH STENT PLACEMENT  05/15/2008    proximal LAD -MiniVision stent 2.5 x 12 and mid LAD 2 overlapping MiniVision 2.0 x 15    SOCIAL HISTORY: Social History   Socioeconomic History  . Marital status: Married    Spouse name: Hendricks Milo  . Number of children: 2  . Years of education: 37  . Highest education level: Not on file  Occupational History  . Occupation: Retired    Fish farm manager: [employer]  Tobacco Use  . Smoking status: Former Smoker    Types: Cigarettes    Quit date: 12/17/2006    Years since quitting: 13.6  . Smokeless tobacco: Never Used  Vaping Use  . Vaping Use: Never used  Substance and Sexual Activity  . Alcohol use: No    Alcohol/week: 0.0 standard drinks  . Drug use: No  . Sexual activity: Yes  Other Topics Concern  . Not on file  Social History Narrative   Lives w/ husband   Right-handed   Drinks about 12 oz caffeinated beverage per day   Social Determinants of Health   Financial Resource Strain: Low Risk   . Difficulty of Paying Living Expenses: Not hard at all  Food Insecurity: No Food Insecurity  .  Worried About Charity fundraiser in the Last Year: Never true  . Ran Out of Food in the Last Year: Never true  Transportation Needs: No Transportation Needs  . Lack of Transportation (Medical): No  . Lack of Transportation (Non-Medical): No  Physical Activity: Sufficiently Active  . Days of Exercise per Week: 5 days  . Minutes of Exercise per Session: 30 min    Stress: No Stress Concern Present  . Feeling of Stress : Not at all  Social Connections:   . Frequency of Communication with Friends and Family: Not on file  . Frequency of Social Gatherings with Friends and Family: Not on file  . Attends Religious Services: Not on file  . Active Member of Clubs or Organizations: Not on file  . Attends Archivist Meetings: Not on file  . Marital Status: Not on file  Intimate Partner Violence:   . Fear of Current or Ex-Partner: Not on file  . Emotionally Abused: Not on file  . Physically Abused: Not on file  . Sexually Abused: Not on file    FAMILY HISTORY: Family History  Problem Relation Age of Onset  . Diabetes Mother   . Hypertension Mother   . Kidney failure Mother   . Transient ischemic attack Father   . Coronary artery disease Father   . Dementia Father   . Cancer Brother     ALLERGIES:  is allergic to iodine and sulfa antibiotics.  MEDICATIONS:  Current Outpatient Medications  Medication Sig Dispense Refill  . Acetylcarnitine HCl (ACETYL-L-CARNITINE HCL) POWD by Does not apply route.    . Ascorbic Acid (VITAMIN C PO) Take 500 mg by mouth daily.     . B Complex Vitamins (B COMPLEX 100 PO) Take 100 mg by mouth daily.    . Blood Pressure Monitoring (OMRON 5 SERIES BP MONITOR) DEVI     . Cholecalciferol (VITAMIN D-3 PO) Take 5,000 Units by mouth daily.     . clonazePAM (KLONOPIN) 0.5 MG tablet Take 1 tablet (0.5 mg total) by mouth daily as needed for anxiety. 30 tablet 1  . Coenzyme Q10 (CO Q 10 PO) Take 100 mg by mouth daily.     Marland Kitchen ezetimibe (ZETIA) 10 MG tablet Take 1 tablet (10 mg total) by mouth daily. 90 tablet 3  . rosuvastatin (CRESTOR) 40 MG tablet Take 1 tablet (40 mg total) by mouth daily. 90 tablet 3   No current facility-administered medications for this visit.     PHYSICAL EXAMINATION: ECOG PERFORMANCE STATUS: 0 - Asymptomatic Vitals:   07/27/20 1035  BP: 114/81  Pulse: 91  Resp: 16  Temp: 98.2 F  (36.8 C)  SpO2: 100%   Filed Weights   07/27/20 1035  Weight: 129 lb 6.4 oz (58.7 kg)    Physical Exam Constitutional:      General: She is not in acute distress. HENT:     Head: Normocephalic and atraumatic.  Eyes:     General: No scleral icterus. Cardiovascular:     Rate and Rhythm: Normal rate and regular rhythm.     Heart sounds: Normal heart sounds.  Pulmonary:     Effort: Pulmonary effort is normal. No respiratory distress.     Breath sounds: No wheezing.  Abdominal:     General: Bowel sounds are normal. There is no distension.     Palpations: Abdomen is soft.     Comments: Unable to appreciated the edge ofthe liver.  No splenomegaly  Musculoskeletal:  General: No deformity. Normal range of motion.     Cervical back: Normal range of motion and neck supple.  Skin:    General: Skin is warm and dry.     Findings: No erythema or rash.  Neurological:     Mental Status: She is alert and oriented to person, place, and time. Mental status is at baseline.     Cranial Nerves: No cranial nerve deficit.     Coordination: Coordination normal.  Psychiatric:        Mood and Affect: Mood normal.     CMP Latest Ref Rng & Units 07/16/2020  Glucose 65 - 99 mg/dL 82  BUN 8 - 27 mg/dL 15  Creatinine 0.57 - 1.00 mg/dL 0.70  Sodium 134 - 144 mmol/L 143  Potassium 3.5 - 5.2 mmol/L 4.2  Chloride 96 - 106 mmol/L 106  CO2 20 - 29 mmol/L 25  Calcium 8.7 - 10.3 mg/dL 9.5  Total Protein 6.0 - 8.5 g/dL 6.8  Total Bilirubin 0.0 - 1.2 mg/dL 0.5  Alkaline Phos 44 - 121 IU/L 74  AST 0 - 40 IU/L 20  ALT 0 - 32 IU/L 22   CBC Latest Ref Rng & Units 07/27/2020  WBC 4.0 - 10.5 K/uL 12.6(H)  Hemoglobin 12.0 - 15.0 g/dL 13.6  Hematocrit 36 - 46 % 40.4  Platelets 150 - 400 K/uL 192     RADIOGRAPHIC STUDIES: I have personally reviewed the radiological images as listed and agreed with the findings in the report. No results found.  LABORATORY DATA:  I have reviewed the data as  listed Lab Results  Component Value Date   WBC 12.6 (H) 07/27/2020   HGB 13.6 07/27/2020   HCT 40.4 07/27/2020   MCV 94.0 07/27/2020   PLT 192 07/27/2020   Recent Labs    01/07/20 1219 01/07/20 1219 03/07/20 1121 05/05/20 0846 07/16/20 1033  NA 143  --  140  --  143  K 4.5  --  3.9  --  4.2  CL 104  --  105  --  106  CO2 26  --  25  --  25  GLUCOSE 82  --  99  --  82  BUN 17  --  15  --  15  CREATININE 0.81  --  0.84  --  0.70  CALCIUM 9.9  --  9.7  --  9.5  GFRNONAA 75  --  >60  --  89  GFRAA 87  --  >60  --  103  PROT 6.9  --  7.4 6.6 6.8  ALBUMIN 4.6  --  4.4 4.6 4.7  AST 24   < > _0 ALT 18   < > _1 ALKPHOS 81   < > 74 83 74  BILITOT 0.5  --  0.7 0.5 0.5  BILIDIR  --   --   --  0.13  --    < > = values in this interval not displayed.   Iron/TIBC/Ferritin/ %Sat No results found for: IRON, TIBC, FERRITIN, IRONPCTSAT      ASSESSMENT & PLAN:  1. Leukocytosis, unspecified type    Labs reviewed and discussed with patient that Leukocytosis, predominantly neutrophilia, can be secondary to infection, chronic inflammation, smoking, autoimmune disease, or underlying bone marrow disorders.   For the work up of patient's leukocytosis, I recommend checking CBC;CMP, LDH, pathology smear review, flowcytometry, hepatitis, HIV etc. Also discussed possible bone marrow biopsy if above workup is  inconclusive; however I would prefer not to do a bone marrow unless absolutely needed.    Orders Placed This Encounter  Procedures  . CBC with Differential/Platelet    Standing Status:   Future    Number of Occurrences:   1    Standing Expiration Date:   07/27/2021  . Flow cytometry panel-leukemia/lymphoma work-up    Standing Status:   Future    Number of Occurrences:   1    Standing Expiration Date:   07/27/2021  . Lactate dehydrogenase    Standing Status:   Future    Number of Occurrences:   1    Standing Expiration Date:   07/27/2021  . Hepatitis panel, acute     Standing Status:   Future    Number of Occurrences:   1    Standing Expiration Date:   07/27/2021  . HIV Antibody (routine testing w rflx)    Standing Status:   Future    Number of Occurrences:   1    Standing Expiration Date:   07/27/2021    All questions were answered. The patient knows to call the clinic with any problems questions or concerns.  Return of visit: 2 weeks to discuss labs. Thank you for this kind referral and the opportunity to participate in the care of this patient. A copy of today's note is routed to referring provider   Earlie Server, MD, PhD Hematology Oncology [facility] Pager- [phone] 07/27/2020

## 2020-08-03 ENCOUNTER — Telehealth: Payer: Self-pay

## 2020-08-03 DIAGNOSIS — D72829 Elevated white blood cell count, unspecified: Secondary | ICD-10-CM

## 2020-08-03 NOTE — Telephone Encounter (Signed)
Patient responded to MyChart message that she will view lab appt detail on MyChart.

## 2020-08-03 NOTE — Telephone Encounter (Signed)
Received message from Collene Mares in [place] lab:  [employer] called and said that they had to reject the Leuk/Lymphoma panel because it was greater than 24 hours old after they sent it to another lab. It will need to be re-ordered.  Dr. Tasia Catchings would like for her to come for redraw.  MyChart message sent to patient

## 2020-08-04 ENCOUNTER — Other Ambulatory Visit: Payer: Self-pay

## 2020-08-04 ENCOUNTER — Inpatient Hospital Stay: Payer: Medicare Other

## 2020-08-04 DIAGNOSIS — G4730 Sleep apnea, unspecified: Secondary | ICD-10-CM | POA: Diagnosis not present

## 2020-08-04 DIAGNOSIS — D72829 Elevated white blood cell count, unspecified: Secondary | ICD-10-CM | POA: Diagnosis not present

## 2020-08-04 DIAGNOSIS — D472 Monoclonal gammopathy: Secondary | ICD-10-CM | POA: Diagnosis not present

## 2020-08-04 DIAGNOSIS — I10 Essential (primary) hypertension: Secondary | ICD-10-CM | POA: Diagnosis not present

## 2020-08-04 DIAGNOSIS — D72820 Lymphocytosis (symptomatic): Secondary | ICD-10-CM | POA: Diagnosis not present

## 2020-08-04 DIAGNOSIS — I2510 Atherosclerotic heart disease of native coronary artery without angina pectoris: Secondary | ICD-10-CM | POA: Diagnosis not present

## 2020-08-04 DIAGNOSIS — E785 Hyperlipidemia, unspecified: Secondary | ICD-10-CM | POA: Diagnosis not present

## 2020-08-04 LAB — COMP PANEL: LEUKEMIA/LYMPHOMA

## 2020-08-06 LAB — COMP PANEL: LEUKEMIA/LYMPHOMA: Immunophenotypic Profile: 39

## 2020-08-10 ENCOUNTER — Other Ambulatory Visit: Payer: Self-pay

## 2020-08-10 ENCOUNTER — Encounter: Payer: Self-pay | Admitting: Oncology

## 2020-08-10 ENCOUNTER — Inpatient Hospital Stay (HOSPITAL_BASED_OUTPATIENT_CLINIC_OR_DEPARTMENT_OTHER): Payer: Medicare Other | Admitting: Oncology

## 2020-08-10 VITALS — BP 147/74 | HR 76 | Temp 97.9°F | Resp 18 | Wt 128.7 lb

## 2020-08-10 DIAGNOSIS — D72820 Lymphocytosis (symptomatic): Secondary | ICD-10-CM

## 2020-08-10 DIAGNOSIS — E785 Hyperlipidemia, unspecified: Secondary | ICD-10-CM | POA: Diagnosis not present

## 2020-08-10 DIAGNOSIS — I2510 Atherosclerotic heart disease of native coronary artery without angina pectoris: Secondary | ICD-10-CM | POA: Diagnosis not present

## 2020-08-10 DIAGNOSIS — I10 Essential (primary) hypertension: Secondary | ICD-10-CM | POA: Diagnosis not present

## 2020-08-10 DIAGNOSIS — D72829 Elevated white blood cell count, unspecified: Secondary | ICD-10-CM | POA: Diagnosis not present

## 2020-08-10 DIAGNOSIS — G4730 Sleep apnea, unspecified: Secondary | ICD-10-CM | POA: Diagnosis not present

## 2020-08-10 DIAGNOSIS — D472 Monoclonal gammopathy: Secondary | ICD-10-CM | POA: Diagnosis not present

## 2020-08-10 NOTE — Progress Notes (Signed)
Pt here for follow up. No new concerns voiced.   

## 2020-08-10 NOTE — Progress Notes (Signed)
Hematology/Oncology Consult note [facility] Telephone:([phone] [phone]   Patient Care Team: Glendale Chard, MD as PCP - General (Internal Medicine)  REFERRING PROVIDER: Glendale Chard, MD  CHIEF COMPLAINTS/REASON FOR VISIT:  Evaluation of leukocytosis  HISTORY OF PRESENTING ILLNESS:  Carolyn Martin is a  69 y.o.  female with PMH listed below who was referred to me for evaluation of leukocytosis Reviewed patient' recent labs obtained by PCP.   07/16/2020 CBC showed elevated white count of 9.6, lymphocyte 6.7. Previous lab records reviewed. Leukocytosis onset of chronic, duration is since at least September 2020.  No aggravating or elevated factors. Associated symptoms or signs:  Denies weight loss, fever, chills,night sweats.  Denies fatigue Smoking history: She is a former smoker.  She quit smoking 2008. History of recent oral steroid use or steroid injection: Denies History of recent infection: Patient had recent COVID-19 infection in September 2021.  Patient got Mab infusion. Autoimmune disease history.  Denies   She reports that her mom has a history of CLL  INTERVAL HISTORY Carolyn Martin is a 69 y.o. female who has above history reviewed by me today presents for follow up visit for management of Lymphocytosis Problems and complaints are listed below:Patient has had blood work done during interval and presents to discuss results. She has had COVID-19 infection recently and has had monoclonal antibody infusion. She has good appetite.  Some weight loss and night sweating which she attributes to perimenopausal and stress in life.  Review of Systems  Constitutional: Negative for appetite change, chills, fatigue and fever.  HENT:   Negative for hearing loss and voice change.   Eyes: Negative for eye problems.  Respiratory: Negative for chest tightness and cough.   Cardiovascular: Negative for chest pain.  Gastrointestinal: Negative  for abdominal distention, abdominal pain and blood in stool.  Endocrine: Negative for hot flashes.  Genitourinary: Negative for difficulty urinating and frequency.   Musculoskeletal: Negative for arthralgias.  Skin: Negative for itching and rash.  Neurological: Negative for extremity weakness.  Hematological: Negative for adenopathy.  Psychiatric/Behavioral: Negative for confusion.     MEDICAL HISTORY:  Past Medical History:  Diagnosis Date  . Anginal pain ([place]) 05/14/2008   atypical chest pain-- Exercise  Myoview  EF 61% ,mod to severe ischemia Basal Anterior ,Anteroseptal,Mid Anterior,Mid Anteroseptal, Apical Anterior and Apical Septal regions   LV normal   . Coronary artery disease 06/23/2010   Echo EF =>55%,LV normal  . Hyperlipidemia   . Hypertension   . Sleep apnea   . Tremor, essential 02/19/2016    SURGICAL HISTORY: Past Surgical History:  Procedure Laterality Date  . CARDIAC CATHETERIZATION  05/15/2008   2 areas of LAD  . CORONARY ANGIOPLASTY WITH STENT PLACEMENT  05/15/2008    proximal LAD -MiniVision stent 2.5 x 12 and mid LAD 2 overlapping MiniVision 2.0 x 15    SOCIAL HISTORY: Social History   Socioeconomic History  . Marital status: Married    Spouse name: Hendricks Milo  . Number of children: 2  . Years of education: 13  . Highest education level: Not on file  Occupational History  . Occupation: Retired    Fish farm manager: [place] [employer]  Tobacco Use  . Smoking status: Former Smoker    Types: Cigarettes    Quit date: 12/17/2006    Years since quitting: 13.6  . Smokeless tobacco: Never Used  Vaping Use  . Vaping Use: Never used  Substance and Sexual Activity  . Alcohol use: No  Alcohol/week: 0.0 standard drinks  . Drug use: No  . Sexual activity: Yes  Other Topics Concern  . Not on file  Social History Narrative   Lives w/ husband   Right-handed   Drinks about 12 oz caffeinated beverage per day   Social Determinants of Health   Financial Resource Strain:  Low Risk   . Difficulty of Paying Living Expenses: Not hard at all  Food Insecurity: No Food Insecurity  . Worried About Charity fundraiser in the Last Year: Never true  . Ran Out of Food in the Last Year: Never true  Transportation Needs: No Transportation Needs  . Lack of Transportation (Medical): No  . Lack of Transportation (Non-Medical): No  Physical Activity: Sufficiently Active  . Days of Exercise per Week: 5 days  . Minutes of Exercise per Session: 30 min  Stress: No Stress Concern Present  . Feeling of Stress : Not at all  Social Connections:   . Frequency of Communication with Friends and Family: Not on file  . Frequency of Social Gatherings with Friends and Family: Not on file  . Attends Religious Services: Not on file  . Active Member of Clubs or Organizations: Not on file  . Attends Archivist Meetings: Not on file  . Marital Status: Not on file  Intimate Partner Violence:   . Fear of Current or Ex-Partner: Not on file  . Emotionally Abused: Not on file  . Physically Abused: Not on file  . Sexually Abused: Not on file    FAMILY HISTORY: Family History  Problem Relation Age of Onset  . Diabetes Mother   . Hypertension Mother   . Kidney failure Mother   . Transient ischemic attack Father   . Coronary artery disease Father   . Dementia Father   . Cancer Brother     ALLERGIES:  is allergic to iodine and sulfa antibiotics.  MEDICATIONS:  Current Outpatient Medications  Medication Sig Dispense Refill  . Acetylcarnitine HCl (ACETYL-L-CARNITINE HCL) POWD by Does not apply route.    . Ascorbic Acid (VITAMIN C PO) Take 500 mg by mouth daily.     . B Complex Vitamins (B COMPLEX 100 PO) Take 100 mg by mouth daily.    . Blood Pressure Monitoring (OMRON 5 SERIES BP MONITOR) DEVI     . Cholecalciferol (VITAMIN D-3 PO) Take 5,000 Units by mouth daily.     . clonazePAM (KLONOPIN) 0.5 MG tablet Take 1 tablet (0.5 mg total) by mouth daily as needed for anxiety.  30 tablet 1  . Coenzyme Q10 (CO Q 10 PO) Take 100 mg by mouth daily.     Marland Kitchen ezetimibe (ZETIA) 10 MG tablet Take 1 tablet (10 mg total) by mouth daily. 90 tablet 3  . rosuvastatin (CRESTOR) 40 MG tablet Take 1 tablet (40 mg total) by mouth daily. 90 tablet 3   No current facility-administered medications for this visit.     PHYSICAL EXAMINATION: ECOG PERFORMANCE STATUS: 0 - Asymptomatic Vitals:   08/10/20 0951  BP: (!) 147/74  Pulse: 76  Resp: 18  Temp: 97.9 F (36.6 C)   Filed Weights   08/10/20 0951  Weight: 128 lb 11.2 oz (58.4 kg)    Physical Exam Constitutional:      General: She is not in acute distress. HENT:     Head: Normocephalic and atraumatic.  Eyes:     General: No scleral icterus. Cardiovascular:     Rate and Rhythm: Normal rate and regular  rhythm.     Heart sounds: Normal heart sounds.  Pulmonary:     Effort: Pulmonary effort is normal. No respiratory distress.     Breath sounds: No wheezing.  Abdominal:     General: Bowel sounds are normal. There is no distension.     Palpations: Abdomen is soft.     Comments: Unable to appreciated the edge ofthe liver.  No splenomegaly  Musculoskeletal:        General: No deformity. Normal range of motion.     Cervical back: Normal range of motion and neck supple.  Skin:    General: Skin is warm and dry.     Findings: No erythema or rash.  Neurological:     Mental Status: She is alert and oriented to person, place, and time. Mental status is at baseline.     Cranial Nerves: No cranial nerve deficit.     Coordination: Coordination normal.  Psychiatric:        Mood and Affect: Mood normal.     CMP Latest Ref Rng & Units 07/16/2020  Glucose 65 - 99 mg/dL 82  BUN 8 - 27 mg/dL 15  Creatinine 0.57 - 1.00 mg/dL 0.70  Sodium 134 - 144 mmol/L 143  Potassium 3.5 - 5.2 mmol/L 4.2  Chloride 96 - 106 mmol/L 106  CO2 20 - 29 mmol/L 25  Calcium 8.7 - 10.3 mg/dL 9.5  Total Protein 6.0 - 8.5 g/dL 6.8  Total Bilirubin 0.0  - 1.2 mg/dL 0.5  Alkaline Phos 44 - 121 IU/L 74  AST 0 - 40 IU/L 20  ALT 0 - 32 IU/L 22   CBC Latest Ref Rng & Units 07/27/2020  WBC 4.0 - 10.5 K/uL 12.6(H)  Hemoglobin 12.0 - 15.0 g/dL 13.6  Hematocrit 36 - 46 % 40.4  Platelets 150 - 400 K/uL 192     RADIOGRAPHIC STUDIES: I have personally reviewed the radiological images as listed and agreed with the findings in the report. No results found.  LABORATORY DATA:  I have reviewed the data as listed Lab Results  Component Value Date   WBC 12.6 (H) 07/27/2020   HGB 13.6 07/27/2020   HCT 40.4 07/27/2020   MCV 94.0 07/27/2020   PLT 192 07/27/2020   Recent Labs    01/07/20 1219 01/07/20 1219 03/07/20 1121 05/05/20 0846 07/16/20 1033  NA 143  --  140  --  143  K 4.5  --  3.9  --  4.2  CL 104  --  105  --  106  CO2 26  --  25  --  25  GLUCOSE 82  --  99  --  82  BUN 17  --  15  --  15  CREATININE 0.81  --  0.84  --  0.70  CALCIUM 9.9  --  9.7  --  9.5  GFRNONAA 75  --  >60  --  89  GFRAA 87  --  >60  --  103  PROT 6.9  --  7.4 6.6 6.8  ALBUMIN 4.6  --  4.4 4.6 4.7  AST 24   < > 30 24 20   ALT 18   < > 29 28 22   ALKPHOS 81   < > 74 83 74  BILITOT 0.5  --  0.7 0.5 0.5  BILIDIR  --   --   --  0.13  --    < > = values in this interval not displayed.   Iron/TIBC/Ferritin/ %Sat No  results found for: IRON, TIBC, FERRITIN, IRONPCTSAT      ASSESSMENT & PLAN:  1. Monoclonal B-cell lymphocytosis of undetermined significance    #Lymphocytosis, work-up was reviewed and discussed with patient.  Flow cytometry showed monoclonal B-cell lymphocytosis, <5000 /ul, not meeting diagnosis of CLL. I reviewed her blood work.  She has chronic lymphocytosis which was decreased after her recent COVID-19 infection/monoclonal antibody infusion. Wondering if there is any potential temporary lymphocyte suppression. I will repeat another flow cytometry in 6 months. Recommend observation.  Patient agrees with the plan.    Orders Placed This  Encounter  Procedures  . CBC with Differential/Platelet    Standing Status:   Future    Standing Expiration Date:   08/10/2021  . Comprehensive metabolic panel    Standing Status:   Future    Standing Expiration Date:   08/10/2021  . Lactate dehydrogenase    Standing Status:   Future    Standing Expiration Date:   08/10/2021  . Flow cytometry panel-leukemia/lymphoma work-up    Standing Status:   Future    Standing Expiration Date:   08/10/2021    All questions were answered. The patient knows to call the clinic with any problems questions or concerns.  Return of visit: 6 months  Earlie Server, MD, PhD Hematology Oncology [facility] Pager- [phone] 08/10/2020

## 2020-09-08 DIAGNOSIS — E782 Mixed hyperlipidemia: Secondary | ICD-10-CM | POA: Diagnosis not present

## 2020-09-08 DIAGNOSIS — I2583 Coronary atherosclerosis due to lipid rich plaque: Secondary | ICD-10-CM | POA: Diagnosis not present

## 2020-09-08 DIAGNOSIS — I2510 Atherosclerotic heart disease of native coronary artery without angina pectoris: Secondary | ICD-10-CM | POA: Diagnosis not present

## 2020-09-09 LAB — HEPATIC FUNCTION PANEL
ALT: 29 IU/L (ref 0–32)
AST: 24 IU/L (ref 0–40)
Albumin: 4.4 g/dL (ref 3.8–4.8)
Alkaline Phosphatase: 81 IU/L (ref 44–121)
Bilirubin Total: 0.6 mg/dL (ref 0.0–1.2)
Bilirubin, Direct: 0.17 mg/dL (ref 0.00–0.40)
Total Protein: 6.8 g/dL (ref 6.0–8.5)

## 2020-09-09 LAB — LIPID PANEL
Chol/HDL Ratio: 2.4 ratio (ref 0.0–4.4)
Cholesterol, Total: 141 mg/dL (ref 100–199)
HDL: 59 mg/dL (ref >39)
LDL Chol Calc (NIH): 66 mg/dL (ref 0–99)
Triglycerides: 87 mg/dL (ref 0–149)
VLDL Cholesterol Cal: 16 mg/dL (ref 5–40)

## 2020-10-14 ENCOUNTER — Encounter: Payer: Self-pay | Admitting: Internal Medicine

## 2020-10-23 ENCOUNTER — Other Ambulatory Visit: Payer: Self-pay | Admitting: Internal Medicine

## 2020-10-23 NOTE — Telephone Encounter (Signed)
Clonazepam refill 

## 2020-12-25 ENCOUNTER — Encounter: Payer: Self-pay | Admitting: Internal Medicine

## 2021-01-04 ENCOUNTER — Other Ambulatory Visit: Payer: Self-pay

## 2021-01-04 ENCOUNTER — Encounter: Payer: Self-pay | Admitting: Internal Medicine

## 2021-01-04 ENCOUNTER — Ambulatory Visit (INDEPENDENT_AMBULATORY_CARE_PROVIDER_SITE_OTHER): Payer: Medicare Other | Admitting: Internal Medicine

## 2021-01-04 VITALS — BP 112/76 | HR 70 | Temp 97.9°F | Ht 62.5 in | Wt 131.2 lb

## 2021-01-04 DIAGNOSIS — I2583 Coronary atherosclerosis due to lipid rich plaque: Secondary | ICD-10-CM | POA: Diagnosis not present

## 2021-01-04 DIAGNOSIS — E7800 Pure hypercholesterolemia, unspecified: Secondary | ICD-10-CM | POA: Diagnosis not present

## 2021-01-04 DIAGNOSIS — I119 Hypertensive heart disease without heart failure: Secondary | ICD-10-CM | POA: Diagnosis not present

## 2021-01-04 DIAGNOSIS — Z79899 Other long term (current) drug therapy: Secondary | ICD-10-CM

## 2021-01-04 DIAGNOSIS — I2510 Atherosclerotic heart disease of native coronary artery without angina pectoris: Secondary | ICD-10-CM | POA: Diagnosis not present

## 2021-01-04 DIAGNOSIS — R61 Generalized hyperhidrosis: Secondary | ICD-10-CM

## 2021-01-04 NOTE — Patient Instructions (Signed)

## 2021-01-04 NOTE — Progress Notes (Signed)
I,[name],acting as a Education administrator for [name], MD.,have documented all relevant documentation on the behalf of [name], MD,as directed by  [name], MD while in the presence of [name], MD.  This visit occurred during the SARS-CoV-2 public health emergency.  Safety protocols were in place, including screening questions prior to the visit, additional usage of staff PPE, and extensive cleaning of exam room while observing appropriate contact time as indicated for disinfecting solutions.  Subjective:     Patient ID: Carolyn Martin , female    DOB: [birth year] , 70 y.o.   MRN: [id number]   Chief Complaint  Patient presents with  . Hypertension    HPI  Pt is here for a follow up on a b/p check. She weaned herself off of meds years ago. Denies headaches, chest pain and shortness of breath. She does check her BP on a regular basis.   Hypertension This is a chronic problem. The current episode started more than 1 year ago. The problem has been gradually improving since onset. The problem is controlled. Pertinent negatives include no blurred vision, chest pain, palpitations or shortness of breath. Risk factors for coronary artery disease include post-menopausal state and dyslipidemia. Past treatments include lifestyle changes. The current treatment provides moderate improvement. There are no compliance problems.  Hypertensive end-organ damage includes CAD/MI.  Hyperlipidemia This is a chronic problem. The problem is controlled. Pertinent negatives include no chest pain or shortness of breath. Risk factors for coronary artery disease include dyslipidemia and post-menopausal.     Past Medical History:  Diagnosis Date  . Anginal pain ([place]) 05/14/2008   atypical chest pain-- Exercise  Myoview  EF 61% ,mod to severe ischemia Basal Anterior ,Anteroseptal,Mid Anterior,Mid Anteroseptal, Apical Anterior and Apical Septal regions   LV normal   . Coronary artery disease  06/23/2010   Echo EF =>55%,LV normal  . Hyperlipidemia   . Hypertension   . Sleep apnea   . Tremor, essential 02/19/2016     Family History  Problem Relation Age of Onset  . Diabetes Mother   . Hypertension Mother   . Kidney failure Mother   . Transient ischemic attack Father   . Coronary artery disease Father   . Dementia Father   . Cancer Brother      Current Outpatient Medications:  .  Acetylcarnitine HCl (ACETYL-L-CARNITINE HCL) POWD, by Does not apply route., Disp: , Rfl:  .  Ascorbic Acid (VITAMIN C PO), Take 500 mg by mouth daily. , Disp: , Rfl:  .  B Complex Vitamins (B COMPLEX 100 PO), Take 100 mg by mouth daily., Disp: , Rfl:  .  Blood Pressure Monitoring (OMRON 5 SERIES BP MONITOR) DEVI, , Disp: , Rfl:  .  Cholecalciferol (VITAMIN D-3 PO), Take 5,000 Units by mouth daily. , Disp: , Rfl:  .  clonazePAM (KLONOPIN) 0.5 MG tablet, TAKE 1 TABLET(0.5 MG) BY MOUTH DAILY AS NEEDED FOR ANXIETY, Disp: 30 tablet, Rfl: 1 .  Coenzyme Q10 (CO Q 10 PO), Take 100 mg by mouth daily. , Disp: , Rfl:  .  ezetimibe (ZETIA) 10 MG tablet, Take 1 tablet (10 mg total) by mouth daily., Disp: 90 tablet, Rfl: 3 .  rosuvastatin (CRESTOR) 40 MG tablet, Take 1 tablet (40 mg total) by mouth daily., Disp: 90 tablet, Rfl: 3   Allergies  Allergen Reactions  . Iodine Hives  . Sulfa Antibiotics      Review of Systems  Constitutional: Negative.  She c/o night sweats. Sx are occurring with more frequency. Denies cough and change in appetite. Admits she visits her aunt in [place] once weekly.   Eyes: Negative for blurred vision.  Respiratory: Negative.  Negative for shortness of breath.   Cardiovascular: Negative.  Negative for chest pain and palpitations.  Gastrointestinal: Negative.   Endocrine:       She c/o night sweats. Not sure what is contributing to her sx. Denies cough, fever, chills. No change in appetite.  Psychiatric/Behavioral: Negative.   All other systems reviewed and are negative.     Today's Vitals   01/04/21 1455  BP: 112/76  Pulse: 70  Temp: 97.9 F (36.6 C)  TempSrc: Oral  Weight: 131 lb 3.2 oz (59.5 kg)  Height: 5' 2.5" (1.588 m)  PainSc: 4   PainLoc: Neck   Body mass index is 23.61 kg/m.  Wt Readings from Last 3 Encounters:  01/04/21 131 lb 3.2 oz (59.5 kg)  08/10/20 128 lb 11.2 oz (58.4 kg)  07/27/20 129 lb 6.4 oz (58.7 kg)   Objective:  Physical Exam Vitals and nursing note reviewed.  Constitutional:      Appearance: Normal appearance. She is obese.  HENT:     Head: Normocephalic and atraumatic.     Nose:     Comments: Masked     Mouth/Throat:     Comments: Masked  Cardiovascular:     Rate and Rhythm: Normal rate and regular rhythm.     Heart sounds: Normal heart sounds.  Pulmonary:     Breath sounds: Normal breath sounds.  Musculoskeletal:     Cervical back: Normal range of motion.  Skin:    General: Skin is warm.  Neurological:     General: No focal deficit present.     Mental Status: She is alert and oriented to person, place, and time.         Assessment And Plan:     1. Benign hypertensive heart disease without heart failure Comments: Chronic, well controlled off of meds. Encouraged to follow low sodium diet.  2. Coronary artery disease due to lipid rich plaque Comments: She is encouraged to follow heart healthy lifestyle - clean eating, active lifestyle and med compliance.   3. Pure hypercholesterolemia Comments: Chronic, will check fasting lipid panel. She does report compliance with statin therapy.   4. Night sweats Comments: I will check labs as listed below.  - TSH - QuantiFERON-TB Gold Plus  5. Drug therapy - BMP8+EGFR - ALT     Patient was given opportunity to ask questions. Patient verbalized understanding of the plan and was able to repeat key elements of the plan. All questions were answered to their satisfaction.    I, [name], MD, have reviewed all documentation for this visit. The  documentation on 01/24/21 for the exam, diagnosis, procedures, and orders are all accurate and complete.   IF YOU HAVE BEEN REFERRED TO A SPECIALIST, IT MAY TAKE 1-2 WEEKS TO SCHEDULE/PROCESS THE REFERRAL. IF YOU HAVE NOT HEARD FROM US/SPECIALIST IN TWO WEEKS, PLEASE GIVE [place] A CALL AT [phone] X [phone].   THE PATIENT IS ENCOURAGED TO PRACTICE SOCIAL DISTANCING DUE TO THE COVID-19 PANDEMIC.

## 2021-01-06 LAB — BMP8+EGFR
BUN/Creatinine Ratio: 27 (ref 12–28)
BUN: 25 mg/dL (ref 8–27)
CO2: 24 mmol/L (ref 20–29)
Calcium: 9.3 mg/dL (ref 8.7–10.3)
Chloride: 103 mmol/L (ref 96–106)
Creatinine, Ser: 0.94 mg/dL (ref 0.57–1.00)
Glucose: 85 mg/dL (ref 65–99)
Potassium: 4.2 mmol/L (ref 3.5–5.2)
Sodium: 142 mmol/L (ref 134–144)
eGFR: 66 mL/min/{1.73_m2} (ref >59)

## 2021-01-06 LAB — QUANTIFERON-TB GOLD PLUS
QuantiFERON Mitogen Value: >10 IU/mL
QuantiFERON Nil Value: 0.04 IU/mL
QuantiFERON TB1 Ag Value: 0.06 IU/mL
QuantiFERON TB2 Ag Value: 0.07 IU/mL
QuantiFERON-TB Gold Plus: NEGATIVE

## 2021-01-06 LAB — ALT: ALT: 25 IU/L (ref 0–32)

## 2021-01-06 LAB — TSH: TSH: 0.762 u[IU]/mL (ref 0.450–4.500)

## 2021-01-13 ENCOUNTER — Ambulatory Visit: Payer: Medicare Other | Admitting: Internal Medicine

## 2021-01-27 ENCOUNTER — Encounter: Payer: Self-pay | Admitting: Internal Medicine

## 2021-02-08 ENCOUNTER — Inpatient Hospital Stay: Payer: Medicare Other

## 2021-02-08 ENCOUNTER — Telehealth: Payer: Self-pay

## 2021-02-08 ENCOUNTER — Encounter: Payer: Self-pay | Admitting: Internal Medicine

## 2021-02-08 ENCOUNTER — Other Ambulatory Visit: Payer: Self-pay

## 2021-02-08 ENCOUNTER — Ambulatory Visit: Payer: Medicare Other | Admitting: Nurse Practitioner

## 2021-02-08 ENCOUNTER — Telehealth: Payer: Self-pay | Admitting: Oncology

## 2021-02-08 ENCOUNTER — Ambulatory Visit (INDEPENDENT_AMBULATORY_CARE_PROVIDER_SITE_OTHER): Payer: Medicare Other | Admitting: Internal Medicine

## 2021-02-08 VITALS — BP 136/70 | HR 81 | Temp 98.4°F | Ht 62.8 in | Wt 134.0 lb

## 2021-02-08 DIAGNOSIS — I2583 Coronary atherosclerosis due to lipid rich plaque: Secondary | ICD-10-CM | POA: Diagnosis not present

## 2021-02-08 DIAGNOSIS — R21 Rash and other nonspecific skin eruption: Secondary | ICD-10-CM

## 2021-02-08 DIAGNOSIS — I2510 Atherosclerotic heart disease of native coronary artery without angina pectoris: Secondary | ICD-10-CM | POA: Diagnosis not present

## 2021-02-08 DIAGNOSIS — R238 Other skin changes: Secondary | ICD-10-CM

## 2021-02-08 MED ORDER — VALACYCLOVIR HCL 1 G PO TABS: ORAL_TABLET | ORAL | 0 refills | Status: DC

## 2021-02-08 NOTE — Progress Notes (Signed)
I,Carolyn Martin,acting as a Education administrator for Carolyn Greenland, MD.,have documented all relevant documentation on the behalf of Carolyn Greenland, MD,as directed by  Carolyn Greenland, MD while in the presence of Carolyn Greenland, MD.  This visit occurred during the SARS-CoV-2 public health emergency.  Safety protocols were in place, including screening questions prior to the visit, additional usage of staff PPE, and extensive cleaning of exam room while observing appropriate contact time as indicated for disinfecting solutions.  Subjective:     Patient ID: Carolyn Martin , female    DOB: [birth year] , 70 y.o.   MRN: [id number]   Chief Complaint  Patient presents with  . Rash    HPI  The patient is here today for evaluation of shingles. She reports she broke out in a rash two days ago. She states rash is painful. Rash is located on her forehead. She has not had the shingles vaccine. Does report being under a lot of stress.     Past Medical History:  Diagnosis Date  . Anginal pain ([place]) 05/14/2008   atypical chest pain-- Exercise  Myoview  EF 61% ,mod to severe ischemia Basal Anterior ,Anteroseptal,Mid Anterior,Mid Anteroseptal, Apical Anterior and Apical Septal regions   LV normal   . Coronary artery disease 06/23/2010   Echo EF =>55%,LV normal  . Hyperlipidemia   . Hypertension   . Sleep apnea   . Tremor, essential 02/19/2016     Family History  Problem Relation Age of Onset  . Diabetes Mother   . Hypertension Mother   . Kidney failure Mother   . Transient ischemic attack Father   . Coronary artery disease Father   . Dementia Father   . Cancer Brother      Current Outpatient Medications:  .  Acetylcarnitine HCl (ACETYL-L-CARNITINE HCL) POWD, by Does not apply route., Disp: , Rfl:  .  Ascorbic Acid (VITAMIN C PO), Take 500 mg by mouth daily. , Disp: , Rfl:  .  B Complex Vitamins (B COMPLEX 100 PO), Take 100 mg by mouth daily., Disp: , Rfl:  .  Blood Pressure Monitoring (OMRON 5  SERIES BP MONITOR) DEVI, , Disp: , Rfl:  .  Cholecalciferol (VITAMIN D-3 PO), Take 5,000 Units by mouth daily. , Disp: , Rfl:  .  clonazePAM (KLONOPIN) 0.5 MG tablet, TAKE 1 TABLET(0.5 MG) BY MOUTH DAILY AS NEEDED FOR ANXIETY, Disp: 30 tablet, Rfl: 1 .  Coenzyme Q10 (CO Q 10 PO), Take 100 mg by mouth daily. , Disp: , Rfl:  .  rosuvastatin (CRESTOR) 40 MG tablet, Take 1 tablet (40 mg total) by mouth daily., Disp: 90 tablet, Rfl: 3 .  valACYclovir (VALTREX) 1000 MG tablet, One tab po tid, Disp: 21 tablet, Rfl: 0 .  ezetimibe (ZETIA) 10 MG tablet, Take 1 tablet (10 mg total) by mouth daily., Disp: 90 tablet, Rfl: 3   Allergies  Allergen Reactions  . Iodine Hives  . Sulfa Antibiotics      Review of Systems  Constitutional: Negative.   Respiratory: Negative.   Cardiovascular: Negative.   Gastrointestinal: Negative.   Skin: Positive for rash.  Neurological: Negative.   Psychiatric/Behavioral: Negative.      Today's Vitals   02/08/21 1200  BP: 136/70  Pulse: 81  Temp: 98.4 F (36.9 C)  TempSrc: Oral  Weight: 134 lb (60.8 kg)  Height: 5' 2.8" (1.595 m)  PainSc: 4   PainLoc: Head   Body mass index is 23.89 kg/m.  Wt Readings from  Last 3 Encounters:  02/08/21 134 lb (60.8 kg)  01/04/21 131 lb 3.2 oz (59.5 kg)  08/10/20 128 lb 11.2 oz (58.4 kg)   Objective:  Physical Exam Vitals and nursing note reviewed.  Constitutional:      Appearance: Normal appearance.  HENT:     Head: Normocephalic and atraumatic.     Nose:     Comments: Masked     Mouth/Throat:     Comments: Masked  Cardiovascular:     Rate and Rhythm: Normal rate and regular rhythm.     Heart sounds: Normal heart sounds.  Pulmonary:     Effort: Pulmonary effort is normal.     Breath sounds: Normal breath sounds.  Skin:    General: Skin is warm.     Comments: Erythematous papular rash on right forehead, goes slightly past midline. Some vesicular lesions noted.   Also with erythematous rash on b/l cheeks. No  vesicles noted.   Neurological:     General: No focal deficit present.     Mental Status: She is alert.  Psychiatric:        Mood and Affect: Mood normal.        Behavior: Behavior normal.         Assessment And Plan:     1. Vesicular rash Comments: Pt advised that shingles does not cross the midline. I will check VZV ab. I will send rx Valtrex 1gm tid x 7 days.  - Varicella zoster antibody, IgM  2. Malar rash Comments: I will check ANA and make further recommendations once results are available.  - ANA, IFA (with reflex)   Patient was given opportunity to ask questions. Patient verbalized understanding of the plan and was able to repeat key elements of the plan. All questions were answered to their satisfaction.   I, Carolyn Greenland, MD, have reviewed all documentation for this visit. The documentation on 02/27/21 for the exam, diagnosis, procedures, and orders are all accurate and complete.   IF YOU HAVE BEEN REFERRED TO A SPECIALIST, IT MAY TAKE 1-2 WEEKS TO SCHEDULE/PROCESS THE REFERRAL. IF YOU HAVE NOT HEARD FROM US/SPECIALIST IN TWO WEEKS, PLEASE GIVE [place] A CALL AT [phone].   THE PATIENT IS ENCOURAGED TO PRACTICE SOCIAL DISTANCING DUE TO THE COVID-19 PANDEMIC.

## 2021-02-08 NOTE — Telephone Encounter (Signed)
The pt was called and scheduled an evaluation for shingles.

## 2021-02-08 NOTE — Patient Instructions (Signed)
Shingles  Shingles is an infection. It gives you a painful skin rash and blisters that have fluid in them. Shingles is caused by the same germ (virus) that causes chickenpox. Shingles only happens in people who:  Have had chickenpox.  Have been given a shot of medicine (vaccine) to protect against chickenpox. Shingles is rare in this group. The first symptoms of shingles may be itching, tingling, or pain in an area on your skin. A rash will show on your skin a few days or weeks later. The rash is likely to be on one side of your body. The rash usually has a shape like a belt or a band. Over time, the rash turns into fluid-filled blisters. The blisters will break open, change into scabs, and dry up. Medicines may:  Help with pain and itching.  Help you get better sooner.  Help to prevent long-term problems. Follow these instructions at home: Medicines  Take over-the-counter and prescription medicines only as told by your doctor.  Put on an anti-itch cream or numbing cream where you have a rash, blisters, or scabs. Do this as told by your doctor. Helping with itching and discomfort  Put cold, wet cloths (cold compresses) on the area of the rash or blisters as told by your doctor.  Cool baths can help you feel better. Try adding baking soda or dry oatmeal to the water to lessen itching. Do not bathe in hot water.   Blister and rash care  Keep your rash covered with a loose bandage (dressing).  Wear loose clothing that does not rub on your rash.  Keep your rash and blisters clean. To do this, wash the area with mild soap and cool water as told by your doctor.  Check your rash every day for signs of infection. Check for: ? More redness, swelling, or pain. ? Fluid or blood. ? Warmth. ? Pus or a bad smell.  Do not scratch your rash. Do not pick at your blisters. To help you to not scratch: ? Keep your fingernails clean and cut short. ? Wear gloves or mittens when you sleep, if  scratching is a problem. General instructions  Rest as told by your doctor.  Keep all follow-up visits as told by your doctor. This is important.  Wash your hands often with soap and water. If soap and water are not available, use hand sanitizer. Doing this lowers your chance of getting a skin infection caused by germs (bacteria).  Your infection can cause chickenpox in people who have never had chickenpox or never got a shot of chickenpox vaccine. If you have blisters that did not change into scabs yet, try not to touch other people or be around other people, especially: ? Babies. ? Pregnant women. ? Children who have areas of red, itchy, or rough skin (eczema). ? Very old people who have transplants. ? People who have a long-term (chronic) sickness, like cancer or AIDS. Contact a doctor if:  Your pain does not get better with medicine.  Your pain does not get better after the rash heals.  You have any signs of infection in the rash area. These signs include: ? More redness, swelling, or pain around the rash. ? Fluid or blood coming from the rash. ? The rash area feeling warm to the touch. ? Pus or a bad smell coming from the rash. Get help right away if:  The rash is on your face or nose.  You have pain in your face or pain  by your eye.  You lose feeling on one side of your face.  You have trouble seeing.  You have ear pain, or you have ringing in your ear.  You have a loss of taste.  Your condition gets worse. Summary  Shingles gives you a painful skin rash and blisters that have fluid in them.  Shingles is an infection. It is caused by the same germ (virus) that causes chickenpox.  Keep your rash covered with a loose bandage (dressing). Wear loose clothing that does not rub on your rash.  If you have blisters that did not change into scabs yet, try not to touch other people or be around people. This information is not intended to replace advice given to you by  your health care provider. Make sure you discuss any questions you have with your health care provider. Document Revised: 01/25/2019 Document Reviewed: 06/07/2017 Elsevier Patient Education  2021 [employer].

## 2021-02-08 NOTE — Telephone Encounter (Signed)
02/08/2021 Pt called to inform Dr. Tasia Catchings that she has shingles and will not be able to come in for her labwork today. She said she is seeing her PCP today and will call back to r/s both the labs and MD appt. SRW

## 2021-02-09 LAB — ANTINUCLEAR ANTIBODIES, IFA: ANA Titer 1: NEGATIVE

## 2021-02-09 LAB — VARICELLA ZOSTER ANTIBODY, IGM: Varicella IgM: <0.91 index (ref 0.00–0.90)

## 2021-02-15 ENCOUNTER — Ambulatory Visit: Payer: Medicare Other | Admitting: Oncology

## 2021-02-26 ENCOUNTER — Encounter: Payer: Self-pay | Admitting: Internal Medicine

## 2021-02-26 ENCOUNTER — Other Ambulatory Visit: Payer: Self-pay

## 2021-04-23 ENCOUNTER — Other Ambulatory Visit: Payer: Self-pay

## 2021-04-23 ENCOUNTER — Encounter: Payer: Self-pay | Admitting: Cardiovascular Disease

## 2021-04-23 ENCOUNTER — Ambulatory Visit (INDEPENDENT_AMBULATORY_CARE_PROVIDER_SITE_OTHER): Payer: Medicare Other | Admitting: Cardiovascular Disease

## 2021-04-23 VITALS — BP 140/72 | HR 66 | Ht 62.0 in | Wt 132.2 lb

## 2021-04-23 DIAGNOSIS — I2510 Atherosclerotic heart disease of native coronary artery without angina pectoris: Secondary | ICD-10-CM

## 2021-04-23 DIAGNOSIS — E782 Mixed hyperlipidemia: Secondary | ICD-10-CM

## 2021-04-23 DIAGNOSIS — I2583 Coronary atherosclerosis due to lipid rich plaque: Secondary | ICD-10-CM

## 2021-04-23 DIAGNOSIS — R21 Rash and other nonspecific skin eruption: Secondary | ICD-10-CM | POA: Diagnosis not present

## 2021-04-23 DIAGNOSIS — I10 Essential (primary) hypertension: Secondary | ICD-10-CM | POA: Diagnosis not present

## 2021-04-23 NOTE — Progress Notes (Signed)
04/23/2021 Carolyn Martin   [birth year]  [id number]  Primary Physician Glendale Chard, MD Primary Cardiologist: Lorretta Harp MD Garret Reddish, [place], [place]  HPI:  Carolyn Martin is a 70 y.o.   thin-appearing married Caucasian female mother of 2, grandmother 2 grandchildren who I last saw in the office 02/25/2020.  She is retired from working at the [employer] for 32 years.  I last saw her in the office 01/21/2016 risk factors include treated hypertension and hyperlipidemia. Her mother did have coronary artery bypass grafting in her 45s. She had LAD stenting, Dr. Melvern Banker using 3 sequential bare-metal stents 05/15/08.    Because of occasional chest pain when I saw her last I obtained a Myoview stress test 02/05/16 which was entirely normal.  She has had no recurrent symptoms.  Her most recent lab work performed 09/08/2020 revealed a total cholesterol of 141, LDL 66 and HDL 59.   Since I saw her a year ago she is remained stable.  She does walk 5 days a week and still mows her lawn with a push mower.  She denies chest pain or shortness of breath.   Current Meds  Medication Sig   Acetylcarnitine HCl (ACETYL-L-CARNITINE HCL) POWD by Does not apply route.   Ascorbic Acid (VITAMIN C PO) Take 500 mg by mouth daily.    B Complex Vitamins (B COMPLEX 100 PO) Take 100 mg by mouth daily.   Blood Pressure Monitoring (OMRON 5 SERIES BP MONITOR) DEVI    Cholecalciferol (VITAMIN D-3 PO) Take 5,000 Units by mouth daily.    clonazePAM (KLONOPIN) 0.5 MG tablet TAKE 1 TABLET(0.5 MG) BY MOUTH DAILY AS NEEDED FOR ANXIETY   Coenzyme Q10 (CO Q 10 PO) Take 100 mg by mouth daily.    rosuvastatin (CRESTOR) 40 MG tablet Take 1 tablet (40 mg total) by mouth daily.     Allergies  Allergen Reactions   Iodine Hives   Sulfa Antibiotics     Social History   Socioeconomic History   Marital status: Married    Spouse name: Hendricks Milo   Number of children: 2   Years of education: 12   Highest education level: Not on  file  Occupational History   Occupation: Retired    Fish farm manager: [employer]  Tobacco Use   Smoking status: Former    Packs/day: 1.00    Pack years: 0.00    Types: Cigarettes    Start date: 04/06/1970    Quit date: 12/16/1996    Years since quitting: 24.3   Smokeless tobacco: Never   Tobacco comments:    1ppd x 7 years,   Vaping Use   Vaping Use: Never used  Substance and Sexual Activity   Alcohol use: No    Alcohol/week: 0.0 standard drinks   Drug use: No   Sexual activity: Yes  Other Topics Concern   Not on file  Social History Narrative   Lives w/ husband   Right-handed   Drinks about 12 oz caffeinated beverage per day   Social Determinants of Health   Financial Resource Strain: Low Risk    Difficulty of Paying Living Expenses: Not hard at all  Food Insecurity: No Food Insecurity   Worried About Charity fundraiser in the Last Year: Never true   Arboriculturist in the Last Year: Never true  Transportation Needs: No Transportation Needs   Lack of Transportation (Medical): No   Lack of Transportation (Non-Medical): No  Physical Activity:  Sufficiently Active   Days of Exercise per Week: 5 days   Minutes of Exercise per Session: 30 min  Stress: No Stress Concern Present   Feeling of Stress : Not at all  Social Connections: Not on file  Intimate Partner Violence: Not on file     Review of Systems: General: negative for chills, fever, night sweats or weight changes.  Cardiovascular: negative for chest pain, dyspnea on exertion, edema, orthopnea, palpitations, paroxysmal nocturnal dyspnea or shortness of breath Dermatological: negative for rash Respiratory: negative for cough or wheezing Urologic: negative for hematuria Abdominal: negative for nausea, vomiting, diarrhea, bright red blood per rectum, melena, or hematemesis Neurologic: negative for visual changes, syncope, or dizziness All other systems reviewed and are otherwise negative except as noted  above.    Blood pressure 140/72, pulse 66, height 5\' 2"  (1.575 m), weight 132 lb 3.2 oz (60 kg), SpO2 96 %.  General appearance: alert and no distress Neck: no adenopathy, no carotid bruit, no JVD, supple, symmetrical, trachea midline, and thyroid not enlarged, symmetric, no tenderness/mass/nodules Lungs: clear to auscultation bilaterally Heart: regular rate and rhythm, S1, S2 normal, no murmur, click, rub or gallop Extremities: extremities normal, atraumatic, no cyanosis or edema Pulses: 2+ and symmetric Skin: Skin color, texture, turgor normal. No rashes or lesions Neurologic: Grossly normal  EKG sinus rhythm at 66 without ST or T wave changes.  I personally reviewed this EKG.  ASSESSMENT AND PLAN:   Essential hypertension History of essential hypertension a blood pressure measured today at 140/72.  She is not on antihypertensive medications.  Hyperlipidemia History of hyperlipidemia on Zetia and rosuvastatin with lipid profile performed 09/08/2020 revealing total cholesterol 141, LDL 66 and HDL 59.  Coronary artery disease due to lipid rich plaque History of CAD status post PCI and bare-metal stenting by Dr. Melvern Banker using 3 sequential stents in her LAD 05/15/2008.  She had a low risk nonischemic Myoview 02/05/2016.  She denies chest pain or shortness of breath.     Lorretta Harp MD FACP,FACC,FAHA, [facility] 04/23/2021 8:13 AM

## 2021-04-23 NOTE — Assessment & Plan Note (Signed)
History of hyperlipidemia on Zetia and rosuvastatin with lipid profile performed 09/08/2020 revealing total cholesterol 141, LDL 66 and HDL 59.

## 2021-04-23 NOTE — Patient Instructions (Signed)
Medication Instructions:  No Changes In Medications at this time.  *If you need a refill on your cardiac medications before your next appointment, please call your pharmacy*  Follow-Up: At [facility], you and your health needs are our priority.  As part of our continuing mission to provide you with exceptional heart care, we have created designated Provider Care Teams.  These Care Teams include your primary Cardiologist (physician) and Advanced Practice Providers (APPs -  Physician Assistants and Nurse Practitioners) who all work together to provide you with the care you need, when you need it.  Your next appointment:   1 year(s)  The format for your next appointment:   In Person  Provider:   Quay Burow, MD

## 2021-04-23 NOTE — Assessment & Plan Note (Addendum)
History of CAD status post PCI and bare-metal stenting by Dr. Melvern Banker using 3 sequential stents in her LAD 05/15/2008.  She had a low risk nonischemic Myoview 02/05/2016.  She denies chest pain or shortness of breath.

## 2021-04-23 NOTE — Assessment & Plan Note (Signed)
History of essential hypertension a blood pressure measured today at 140/72.  She is not on antihypertensive medications.

## 2021-06-08 ENCOUNTER — Other Ambulatory Visit: Payer: Self-pay | Admitting: Internal Medicine

## 2021-06-08 ENCOUNTER — Other Ambulatory Visit: Payer: Self-pay | Admitting: Cardiovascular Disease

## 2021-06-08 DIAGNOSIS — E782 Mixed hyperlipidemia: Secondary | ICD-10-CM

## 2021-07-14 ENCOUNTER — Encounter: Payer: Self-pay | Admitting: Internal Medicine

## 2021-07-15 ENCOUNTER — Encounter: Payer: Self-pay | Admitting: Nurse Practitioner

## 2021-07-15 ENCOUNTER — Encounter: Payer: Self-pay | Admitting: Internal Medicine

## 2021-07-15 ENCOUNTER — Telehealth (INDEPENDENT_AMBULATORY_CARE_PROVIDER_SITE_OTHER): Payer: Medicare Other | Admitting: Nurse Practitioner

## 2021-07-15 VITALS — Temp 98.0°F

## 2021-07-15 DIAGNOSIS — R059 Cough, unspecified: Secondary | ICD-10-CM | POA: Diagnosis not present

## 2021-07-15 DIAGNOSIS — R0981 Nasal congestion: Secondary | ICD-10-CM

## 2021-07-15 MED ORDER — GUAIFENESIN-DM 100-10 MG/5ML PO SYRP: 5.0000 mL | ORAL_SOLUTION | ORAL | 0 refills | Status: DC | PRN

## 2021-07-15 MED ORDER — AMOXICILLIN-POT CLAVULANATE 875-125 MG PO TABS: 1.0000 | ORAL_TABLET | Freq: Two times a day (BID) | ORAL | 0 refills | Status: AC

## 2021-07-15 MED ORDER — BENZONATATE 100 MG PO CAPS: 100.0000 mg | ORAL_CAPSULE | Freq: Three times a day (TID) | ORAL | 1 refills | Status: DC | PRN

## 2021-07-15 NOTE — Progress Notes (Signed)
Virtual Visit via phone due to failed video visit.    This visit type was conducted due to national recommendations for restrictions regarding the COVID-19 Pandemic (e.g. social distancing) in an effort to limit this patient's exposure and mitigate transmission in our community.  Due to her co-morbid illnesses, this patient is at least at moderate risk for complications without adequate follow up.  This format is felt to be most appropriate for this patient at this time.  All issues noted in this document were discussed and addressed.  A limited physical exam was performed with this format.    This visit type was conducted due to national recommendations for restrictions regarding the COVID-19 Pandemic (e.g. social distancing) in an effort to limit this patient's exposure and mitigate transmission in our community.  Patients identity confirmed using two different identifiers.  This format is felt to be most appropriate for this patient at this time.  All issues noted in this document were discussed and addressed.  No physical exam was performed (except for noted visual exam findings with Video Visits).    Date:  07/15/2021   ID:  Carolyn Martin, DOB [birth year], MRN [id number]  Patient Location:  Home   Provider location:   Office  Chief Complaint:  post nasal drainage, sinus congestion, cough. No fever. Tested neg for covid x 2. No SOB or chest pain.   History of Present Illness:    Carolyn Martin is a 70 y.o. female who presents via video conferencing for a telehealth visit today.    The patient does have symptoms concerning for COVID-19 infection (fever, chills, cough, or new shortness of breath).   Drainage in the back of her throat, coughing, sinus pressure, HA, No fever. BP is normal. No SOB, chest pain. She is not vaccinated with Covid.  She took 2 covid test yesterday negative. And a week before it was negative. Symptoms are going on for more than 1 week. She does not want to  come by and get swabbed again. She has had COVID x 3 times in the past 2 years.   Sinus Problem Associated symptoms include congestion, coughing and headaches. Pertinent negatives include no chills or sore throat.    Past Medical History:  Diagnosis Date   Anginal pain ([place]) 05/14/2008   atypical chest pain-- Exercise  Myoview  EF 61% ,mod to severe ischemia Basal Anterior ,Anteroseptal,Mid Anterior,Mid Anteroseptal, Apical Anterior and Apical Septal regions   LV normal    Coronary artery disease 06/23/2010   Echo EF =>55%,LV normal   Hyperlipidemia    Hypertension    Sleep apnea    Tremor, essential 02/19/2016   Past Surgical History:  Procedure Laterality Date   CARDIAC CATHETERIZATION  05/15/2008   2 areas of LAD   CORONARY ANGIOPLASTY WITH STENT PLACEMENT  05/15/2008    proximal LAD -MiniVision stent 2.5 x 12 and mid LAD 2 overlapping MiniVision 2.0 x 15     Current Meds  Medication Sig   Acetylcarnitine HCl (ACETYL-L-CARNITINE HCL) POWD by Does not apply route.   amoxicillin-clavulanate (AUGMENTIN) 875-125 MG tablet Take 1 tablet by mouth 2 (two) times daily for 7 days.   Ascorbic Acid (VITAMIN C PO) Take 500 mg by mouth daily.    B Complex Vitamins (B COMPLEX 100 PO) Take 100 mg by mouth daily.   benzonatate (TESSALON PERLES) 100 MG capsule Take 1 capsule (100 mg total) by mouth 3 (three) times daily as needed for cough.   Cholecalciferol (  VITAMIN D-3 PO) Take 5,000 Units by mouth daily.    clonazePAM (KLONOPIN) 0.5 MG tablet TAKE 1 TABLET(0.5 MG) BY MOUTH DAILY AS NEEDED FOR ANXIETY   Coenzyme Q10 (CO Q 10 PO) Take 100 mg by mouth daily.    ezetimibe (ZETIA) 10 MG tablet Take 1 tablet (10 mg total) by mouth daily.   guaiFENesin-dextromethorphan (ROBITUSSIN DM) 100-10 MG/5ML syrup Take 5 mLs by mouth every 4 (four) hours as needed for cough.   rosuvastatin (CRESTOR) 40 MG tablet TAKE 1 TABLET(40 MG) BY MOUTH DAILY     Allergies:   Iodine and Sulfa antibiotics   Social  History   Tobacco Use   Smoking status: Former    Packs/day: 1.00    Types: Cigarettes    Start date: 04/06/1970    Quit date: 12/16/1996    Years since quitting: 24.5   Smokeless tobacco: Never   Tobacco comments:    1ppd x 7 years,   Vaping Use   Vaping Use: Never used  Substance Use Topics   Alcohol use: No    Alcohol/week: 0.0 standard drinks   Drug use: No     Family Hx: The patient's family history includes Cancer in her brother; Coronary artery disease in her father; Dementia in her father; Diabetes in her mother; Hypertension in her mother; Kidney failure in her mother; Transient ischemic attack in her father.  ROS:   Please see the history of present illness.    Review of Systems  Constitutional:  Negative for chills and fever.  HENT:  Positive for congestion and sinus pain. Negative for sore throat.   Respiratory:  Positive for cough and sputum production.   Cardiovascular:  Negative for chest pain and palpitations.  Gastrointestinal:  Negative for constipation and diarrhea.  Neurological:  Positive for headaches. Negative for weakness.   All other systems reviewed and are negative.   Labs/Other Tests and Data Reviewed:    Recent Labs: 07/27/2020: Hemoglobin 13.6; Platelets 192 01/04/2021: ALT 25; BUN 25; Creatinine, Ser 0.94; Potassium 4.2; Sodium 142; TSH 0.762   Recent Lipid Panel Lab Results  Component Value Date/Time   CHOL 141 09/08/2020 07:26 AM   TRIG 87 09/08/2020 07:26 AM   HDL 59 09/08/2020 07:26 AM   CHOLHDL 2.4 09/08/2020 07:26 AM   LDLCALC 66 09/08/2020 07:26 AM    Wt Readings from Last 3 Encounters:  04/23/21 132 lb 3.2 oz (60 kg)  02/08/21 134 lb (60.8 kg)  01/04/21 131 lb 3.2 oz (59.5 kg)     Exam:    Vital Signs:  Temp 98 F (36.7 C)     Physical Exam Vitals and nursing note reviewed.  Neurological:     Mental Status: She is alert and oriented to person, place, and time.  Psychiatric:        Mood and Affect: Affect normal.     ASSESSMENT & PLAN:    1. Sinus congestion -She took x 2 at home covid test 1 week apart both were negative. -She is not vaccinated; she has had covid x 3 times  -She refused to come in to get tested for covid in the office.  -Nasal drainage with HA and cough. No fever  - amoxicillin-clavulanate (AUGMENTIN) 875-125 MG tablet; Take 1 tablet by mouth 2 (two) times daily for 7 days.  Dispense: 14 tablet; Refill: 0  2. Cough - guaiFENesin-dextromethorphan (ROBITUSSIN DM) 100-10 MG/5ML syrup; Take 5 mLs by mouth every 4 (four) hours as needed for cough.  Dispense: 118 mL; Refill: 0 - benzonatate (TESSALON PERLES) 100 MG capsule; Take 1 capsule (100 mg total) by mouth 3 (three) times daily as needed for cough.  Dispense: 30 capsule; Refill: 1  Advised patient if her symptoms get worse of if she experiences any SOB or chest pain she should go to the emergency room.   The patient was encouraged to call or send a message through [place] for any questions or concerns.   Follow up: if symptoms persist or do not get better.   Side effects and appropriate use of all the medication(s) were discussed with the patient today. Patient advised to use the medication(s) as directed by their healthcare provider. The patient was encouraged to read, review, and understand all associated package inserts and contact our office with any questions or concerns. The patient accepts the risks of the treatment plan and had an opportunity to ask questions.   Advised patient to take Vitamin C, D, Zinc.  Keep yourself hydrated with a lot of water and rest. Take Delsym for cough and Mucinex as need. Take Tylenol or pain reliever every 4-6 hours as needed for pain/fever/body ache. If you have elevated blood pressure, you can take OTC Corcidin. You can also take OTC oscillococcinum to help with your symptoms.  Educated patient if symptoms get worse or if she experiences any SOB, chest pain or pain in her legs to seek immediate  emergency care. Continue to monitor your oxygen levels. Call us if you have any questions. Quarantine for 5 days if tested positive and no symptoms or 10 days if tested positive and have symptoms. Wear a mask around other people.   "I discussed the limitations of evaluation and management by telemedicine and the availability of in person appointments. The patient expressed understanding and agreed to proceed"    Patient was given opportunity to ask questions. Patient verbalized understanding of the plan and was able to repeat key elements of the plan. All questions were answered to their satisfaction.  Raman Winn Muehl, DNP   I, Raman Iram Astorino have reviewed all documentation for this visit. The documentation on 07/15/21 for the exam, diagnosis, procedures, and orders are all accurate and complete.    COVID-19 Education: The signs and symptoms of COVID-19 were discussed with the patient and how to seek care for testing (follow up with PCP or arrange E-visit).  The importance of social distancing was discussed today.  Patient Risk:   After full review of this patients clinical status, I feel that they are at least moderate risk at this time.  Time:   Today, I have spent 10 minutes/ seconds with the patient with telehealth technology discussing above diagnoses.     Medication Adjustments/Labs and Tests Ordered: Current medicines are reviewed at length with the patient today.  Concerns regarding medicines are outlined above.   Tests Ordered: No orders of the defined types were placed in this encounter.   Medication Changes: Meds ordered this encounter  Medications   amoxicillin-clavulanate (AUGMENTIN) 875-125 MG tablet    Sig: Take 1 tablet by mouth 2 (two) times daily for 7 days.    Dispense:  14 tablet    Refill:  0   guaiFENesin-dextromethorphan (ROBITUSSIN DM) 100-10 MG/5ML syrup    Sig: Take 5 mLs by mouth every 4 (four) hours as needed for cough.    Dispense:  118 mL    Refill:  0    benzonatate (TESSALON PERLES) 100 MG capsule    Sig: Take 1  capsule (100 mg total) by mouth 3 (three) times daily as needed for cough.    Dispense:  30 capsule    Refill:  1     Disposition:  Follow up prn  Signed, Bary Castilla, NP

## 2021-07-22 ENCOUNTER — Other Ambulatory Visit: Payer: Self-pay

## 2021-07-22 ENCOUNTER — Ambulatory Visit (INDEPENDENT_AMBULATORY_CARE_PROVIDER_SITE_OTHER): Payer: Medicare Other

## 2021-07-22 ENCOUNTER — Encounter: Payer: Self-pay | Admitting: Internal Medicine

## 2021-07-22 ENCOUNTER — Ambulatory Visit (INDEPENDENT_AMBULATORY_CARE_PROVIDER_SITE_OTHER): Payer: Medicare Other | Admitting: Internal Medicine

## 2021-07-22 VITALS — BP 118/62 | HR 77 | Temp 97.9°F | Ht 62.4 in | Wt 131.4 lb

## 2021-07-22 DIAGNOSIS — I10 Essential (primary) hypertension: Secondary | ICD-10-CM

## 2021-07-22 DIAGNOSIS — I119 Hypertensive heart disease without heart failure: Secondary | ICD-10-CM | POA: Diagnosis not present

## 2021-07-22 DIAGNOSIS — I2583 Coronary atherosclerosis due to lipid rich plaque: Secondary | ICD-10-CM | POA: Diagnosis not present

## 2021-07-22 DIAGNOSIS — I2510 Atherosclerotic heart disease of native coronary artery without angina pectoris: Secondary | ICD-10-CM

## 2021-07-22 DIAGNOSIS — Z1231 Encounter for screening mammogram for malignant neoplasm of breast: Secondary | ICD-10-CM | POA: Diagnosis not present

## 2021-07-22 DIAGNOSIS — G250 Essential tremor: Secondary | ICD-10-CM

## 2021-07-22 DIAGNOSIS — D72820 Lymphocytosis (symptomatic): Secondary | ICD-10-CM | POA: Diagnosis not present

## 2021-07-22 DIAGNOSIS — R946 Abnormal results of thyroid function studies: Secondary | ICD-10-CM | POA: Diagnosis not present

## 2021-07-22 DIAGNOSIS — Z0000 Encounter for general adult medical examination without abnormal findings: Secondary | ICD-10-CM

## 2021-07-22 LAB — POCT URINALYSIS DIPSTICK
Bilirubin, UA: NEGATIVE
Glucose, UA: NEGATIVE
Ketones, UA: NEGATIVE
Leukocytes, UA: NEGATIVE
Nitrite, UA: NEGATIVE
Protein, UA: NEGATIVE
Spec Grav, UA: >=1.03 — AB (ref 1.010–1.025)
Urobilinogen, UA: 0.2 E.U./dL
pH, UA: 6.5 (ref 5.0–8.0)

## 2021-07-22 LAB — POCT UA - MICROALBUMIN
Albumin/Creatinine Ratio, Urine, POC: <30
Creatinine, POC: 300 mg/dL
Microalbumin Ur, POC: 10 mg/L

## 2021-07-22 NOTE — Progress Notes (Signed)
Rich Brave [place],acting as a Education administrator for [name], MD.,have documented all relevant documentation on the behalf of [name], MD,as directed by  [name], MD while in the presence of [name], MD.  This visit occurred during the SARS-CoV-2 public health emergency.  Safety protocols were in place, including screening questions prior to the visit, additional usage of staff PPE, and extensive cleaning of exam room while observing appropriate contact time as indicated for disinfecting solutions.  Subjective:     Patient ID: Carolyn Martin , female    DOB: 02-17-1951 , 70 y.o.   MRN: [id number]   Chief Complaint  Patient presents with   Hypertension    HPI  Pt is here for a follow up on a b/p check. She reports compliance with meds. She denies having any headaches, chest pain and shortness of breath. She is also scheduled for AWV with [facility] Advisor.    Hypertension This is a chronic problem. The current episode started more than 1 year ago. The problem has been gradually improving since onset. The problem is controlled. Pertinent negatives include no blurred vision, chest pain, palpitations or shortness of breath. Risk factors for coronary artery disease include post-menopausal state and dyslipidemia. Past treatments include lifestyle changes. The current treatment provides moderate improvement. There are no compliance problems.  Hypertensive end-organ damage includes CAD/MI.  Hyperlipidemia This is a chronic problem. The problem is controlled. Pertinent negatives include no chest pain or shortness of breath. Risk factors for coronary artery disease include dyslipidemia and post-menopausal.    Past Medical History:  Diagnosis Date   Anginal pain ([place]) 05/14/2008   atypical chest pain-- Exercise  Myoview  EF 61% ,mod to severe ischemia Basal Anterior ,Anteroseptal,Mid Anterior,Mid Anteroseptal, Apical Anterior and Apical Septal regions   LV normal    Coronary artery  disease 06/23/2010   Echo EF =>55%,LV normal   Hyperlipidemia    Hypertension    Sleep apnea    Tremor, essential 02/19/2016     Family History  Problem Relation Age of Onset   Diabetes Mother    Hypertension Mother    Kidney failure Mother    Transient ischemic attack Father    Coronary artery disease Father    Dementia Father    Cancer Brother      Current Outpatient Medications:    Acetylcarnitine HCl (ACETYL-L-CARNITINE HCL) POWD, by Does not apply route. (Patient not taking: Reported on 07/22/2021), Disp: , Rfl:    amoxicillin-clavulanate (AUGMENTIN) 875-125 MG tablet, Take 1 tablet by mouth 2 (two) times daily for 7 days. (Patient not taking: Reported on 07/22/2021), Disp: 14 tablet, Rfl: 0   Ascorbic Acid (VITAMIN C PO), Take 500 mg by mouth daily. , Disp: , Rfl:    B Complex Vitamins (B COMPLEX 100 PO), Take 100 mg by mouth daily., Disp: , Rfl:    benzonatate (TESSALON PERLES) 100 MG capsule, Take 1 capsule (100 mg total) by mouth 3 (three) times daily as needed for cough. (Patient not taking: Reported on 07/22/2021), Disp: 30 capsule, Rfl: 1   Blood Pressure Monitoring (OMRON 5 SERIES BP MONITOR) DEVI, , Disp: , Rfl:    Cholecalciferol (VITAMIN D-3 PO), Take 5,000 Units by mouth daily. , Disp: , Rfl:    clonazePAM (KLONOPIN) 0.5 MG tablet, TAKE 1 TABLET(0.5 MG) BY MOUTH DAILY AS NEEDED FOR ANXIETY, Disp: 30 tablet, Rfl: 0   Coenzyme Q10 (CO Q 10 PO), Take 100 mg by mouth daily. , Disp: ,  Rfl:    ezetimibe (ZETIA) 10 MG tablet, Take 1 tablet (10 mg total) by mouth daily., Disp: 90 tablet, Rfl: 3   guaiFENesin-dextromethorphan (ROBITUSSIN DM) 100-10 MG/5ML syrup, Take 5 mLs by mouth every 4 (four) hours as needed for cough., Disp: 118 mL, Rfl: 0   rosuvastatin (CRESTOR) 40 MG tablet, TAKE 1 TABLET(40 MG) BY MOUTH DAILY, Disp: 90 tablet, Rfl: 3   Allergies  Allergen Reactions   Iodine Hives   Sulfa Antibiotics      Review of Systems  Constitutional: Negative.   Eyes:   Negative for blurred vision.  Respiratory: Negative.  Negative for shortness of breath.   Cardiovascular: Negative.  Negative for chest pain and palpitations.  Neurological: Negative.   Psychiatric/Behavioral: Negative.      Today's Vitals   07/22/21 0920  BP: 118/62  Pulse: 77  Temp: 97.9 F (36.6 C)  Weight: 131 lb 6.3 oz (59.6 kg)  Height: 5' 2.4" (1.585 m)   Body mass index is 23.73 kg/m.  Wt Readings from Last 3 Encounters:  07/22/21 131 lb 6.3 oz (59.6 kg)  07/22/21 131 lb 6.4 oz (59.6 kg)  04/23/21 132 lb 3.2 oz (60 kg)     Objective:  Physical Exam Vitals and nursing note reviewed.  Constitutional:      Appearance: Normal appearance.  HENT:     Head: Normocephalic and atraumatic.     Nose:     Comments: Masked     Mouth/Throat:     Comments: Masked  Eyes:     Extraocular Movements: Extraocular movements intact.  Cardiovascular:     Rate and Rhythm: Normal rate and regular rhythm.     Heart sounds: Normal heart sounds.  Pulmonary:     Effort: Pulmonary effort is normal.     Breath sounds: Normal breath sounds.  Skin:    General: Skin is warm.  Neurological:     General: No focal deficit present.     Mental Status: She is alert.     Comments: Head tremor,   Psychiatric:        Mood and Affect: Mood normal.        Behavior: Behavior normal.        Assessment And Plan:     1. Benign hypertensive heart disease without heart failure Comments: Chronic, well controlled. No med changes today.  Encouraged to follow low sodium diet. I will check renal function today. She will f/u in 6 months.  - CMP14+EGFR - Lipid panel  2. Coronary artery disease due to lipid rich plaque Comments: Chronic, w/o angina. Encouraged to follow heart healthy lifestyle. I will check lipid panel today. Importance of statin compliance stressed to patient.  - CMP14+EGFR - Lipid panel  3. Tremor, essential Comments: Chronic. She is on clonazepam prn.   4. Monoclonal B-cell  lymphocytosis of undetermined significance Comments: I appreciate Hematology input. I will order CBC w/ diff and forward results to Hematology.  - CBC with Diff  5. Breast cancer screening by mammogram Comments: Last mammo 2020. [facility]  - MM DIGITAL SCREENING BILATERAL; Future   Patient was given opportunity to ask questions. Patient verbalized understanding of the plan and was able to repeat key elements of the plan. All questions were answered to their satisfaction.   I, [name], MD, have reviewed all documentation for this visit. The documentation on 07/22/21 for the exam, diagnosis, procedures, and orders are all accurate and complete.  IF YOU HAVE BEEN REFERRED TO A SPECIALIST, IT MAY TAKE 1-2 WEEKS TO SCHEDULE/PROCESS THE REFERRAL. IF YOU HAVE NOT HEARD FROM US/SPECIALIST IN TWO WEEKS, PLEASE GIVE [place] A CALL AT [phone].   THE PATIENT IS ENCOURAGED TO PRACTICE SOCIAL DISTANCING DUE TO THE COVID-19 PANDEMIC.

## 2021-07-22 NOTE — Patient Instructions (Signed)

## 2021-07-22 NOTE — Addendum Note (Signed)
Addended by: Kellie Simmering on: 07/22/2021 10:44 AM   Modules accepted: Orders

## 2021-07-22 NOTE — Patient Instructions (Signed)
Carolyn Martin , Thank you for taking time to come for your Medicare Wellness Visit. I appreciate your ongoing commitment to your health goals. Please review the following plan we discussed and let me know if I can assist you in the future.   Screening recommendations/referrals: Colonoscopy: completed 05/30/2018 Mammogram: patient to schedule Bone Density: completed 06/02/2017 Recommended yearly ophthalmology/optometry visit for glaucoma screening and checkup Recommended yearly dental visit for hygiene and checkup  Vaccinations: Influenza vaccine: decline Pneumococcal vaccine: completed 07/19/2017 Tdap vaccine: completed 02/24/2018, due 02/25/2028 Shingles vaccine: decline   Covid-19:decline  Advanced directives: Please bring a copy of your POA (Power of Attorney) and/or Living Will to your next appointment.   Conditions/risks identified: none  Next appointment: Follow up in one year for your annual wellness visit    Preventive Care 65 Years and Older, Female Preventive care refers to lifestyle choices and visits with your health care provider that can promote health and wellness. What does preventive care include? A yearly physical exam. This is also called an annual well check. Dental exams once or twice a year. Routine eye exams. Ask your health care provider how often you should have your eyes checked. Personal lifestyle choices, including: Daily care of your teeth and gums. Regular physical activity. Eating a healthy diet. Avoiding tobacco and drug use. Limiting alcohol use. Practicing safe sex. Taking low-dose aspirin every day. Taking vitamin and mineral supplements as recommended by your health care provider. What happens during an annual well check? The services and screenings done by your health care provider during your annual well check will depend on your age, overall health, lifestyle risk factors, and family history of disease. Counseling  Your health care provider may  ask you questions about your: Alcohol use. Tobacco use. Drug use. Emotional well-being. Home and relationship well-being. Sexual activity. Eating habits. History of falls. Memory and ability to understand (cognition). Work and work Statistician. Reproductive health. Screening  You may have the following tests or measurements: Height, weight, and BMI. Blood pressure. Lipid and cholesterol levels. These may be checked every 5 years, or more frequently if you are over 101 years old. Skin check. Lung cancer screening. You may have this screening every year starting at age 73 if you have a 30-pack-year history of smoking and currently smoke or have quit within the past 15 years. Fecal occult blood test (FOBT) of the stool. You may have this test every year starting at age 69. Flexible sigmoidoscopy or colonoscopy. You may have a sigmoidoscopy every 5 years or a colonoscopy every 10 years starting at age 65. Hepatitis C blood test. Hepatitis B blood test. Sexually transmitted disease (STD) testing. Diabetes screening. This is done by checking your blood sugar (glucose) after you have not eaten for a while (fasting). You may have this done every 1-3 years. Bone density scan. This is done to screen for osteoporosis. You may have this done starting at age 68. Mammogram. This may be done every 1-2 years. Talk to your health care provider about how often you should have regular mammograms. Talk with your health care provider about your test results, treatment options, and if necessary, the need for more tests. Vaccines  Your health care provider may recommend certain vaccines, such as: Influenza vaccine. This is recommended every year. Tetanus, diphtheria, and acellular pertussis (Tdap, Td) vaccine. You may need a Td booster every 10 years. Zoster vaccine. You may need this after age 45. Pneumococcal 13-valent conjugate (PCV13) vaccine. One dose is recommended after  age 20. Pneumococcal  polysaccharide (PPSV23) vaccine. One dose is recommended after age 7. Talk to your health care provider about which screenings and vaccines you need and how often you need them. This information is not intended to replace advice given to you by your health care provider. Make sure you discuss any questions you have with your health care provider. Document Released: 10/30/2015 Document Revised: 06/22/2016 Document Reviewed: 08/04/2015 Elsevier Interactive Patient Education  2017 [employer] Prevention in the Home Falls can cause injuries. They can happen to people of all ages. There are many things you can do to make your home safe and to help prevent falls. What can I do on the outside of my home? Regularly fix the edges of walkways and driveways and fix any cracks. Remove anything that might make you trip as you walk through a door, such as a raised step or threshold. Trim any bushes or trees on the path to your home. Use bright outdoor lighting. Clear any walking paths of anything that might make someone trip, such as rocks or tools. Regularly check to see if handrails are loose or broken. Make sure that both sides of any steps have handrails. Any raised decks and porches should have guardrails on the edges. Have any leaves, snow, or ice cleared regularly. Use sand or salt on walking paths during winter. Clean up any spills in your garage right away. This includes oil or grease spills. What can I do in the bathroom? Use night lights. Install grab bars by the toilet and in the tub and shower. Do not use towel bars as grab bars. Use non-skid mats or decals in the tub or shower. If you need to sit down in the shower, use a plastic, non-slip stool. Keep the floor dry. Clean up any water that spills on the floor as soon as it happens. Remove soap buildup in the tub or shower regularly. Attach bath mats securely with double-sided non-slip rug tape. Do not have throw rugs and other  things on the floor that can make you trip. What can I do in the bedroom? Use night lights. Make sure that you have a light by your bed that is easy to reach. Do not use any sheets or blankets that are too big for your bed. They should not hang down onto the floor. Have a firm chair that has side arms. You can use this for support while you get dressed. Do not have throw rugs and other things on the floor that can make you trip. What can I do in the kitchen? Clean up any spills right away. Avoid walking on wet floors. Keep items that you use a lot in easy-to-reach places. If you need to reach something above you, use a strong step stool that has a grab bar. Keep electrical cords out of the way. Do not use floor polish or wax that makes floors slippery. If you must use wax, use non-skid floor wax. Do not have throw rugs and other things on the floor that can make you trip. What can I do with my stairs? Do not leave any items on the stairs. Make sure that there are handrails on both sides of the stairs and use them. Fix handrails that are broken or loose. Make sure that handrails are as long as the stairways. Check any carpeting to make sure that it is firmly attached to the stairs. Fix any carpet that is loose or worn. Avoid having throw rugs  at the top or bottom of the stairs. If you do have throw rugs, attach them to the floor with carpet tape. Make sure that you have a light switch at the top of the stairs and the bottom of the stairs. If you do not have them, ask someone to add them for you. What else can I do to help prevent falls? Wear shoes that: Do not have high heels. Have rubber bottoms. Are comfortable and fit you well. Are closed at the toe. Do not wear sandals. If you use a stepladder: Make sure that it is fully opened. Do not climb a closed stepladder. Make sure that both sides of the stepladder are locked into place. Ask someone to hold it for you, if possible. Clearly  mark and make sure that you can see: Any grab bars or handrails. First and last steps. Where the edge of each step is. Use tools that help you move around (mobility aids) if they are needed. These include: Canes. Walkers. Scooters. Crutches. Turn on the lights when you go into a dark area. Replace any light bulbs as soon as they burn out. Set up your furniture so you have a clear path. Avoid moving your furniture around. If any of your floors are uneven, fix them. If there are any pets around you, be aware of where they are. Review your medicines with your doctor. Some medicines can make you feel dizzy. This can increase your chance of falling. Ask your doctor what other things that you can do to help prevent falls. This information is not intended to replace advice given to you by your health care provider. Make sure you discuss any questions you have with your health care provider. Document Released: 07/30/2009 Document Revised: 03/10/2016 Document Reviewed: 11/07/2014 Elsevier Interactive Patient Education  2017 [employer].

## 2021-07-22 NOTE — Progress Notes (Signed)
This visit occurred during the SARS-CoV-2 public health emergency.  Safety protocols were in place, including screening questions prior to the visit, additional usage of staff PPE, and extensive cleaning of exam room while observing appropriate contact time as indicated for disinfecting solutions.  Subjective:   Carolyn Martin is a 70 y.o. female who presents for Medicare Annual (Subsequent) preventive examination.  Review of Systems     Cardiac Risk Factors include: advanced age (>62men, >28 women);hypertension     Objective:    Today's Vitals   07/22/21 0848  BP: 118/62  Pulse: 77  Temp: 97.9 F (36.6 C)  TempSrc: Oral  SpO2: 97%  Weight: 131 lb 6.4 oz (59.6 kg)  Height: 5' 2.4" (1.585 m)   Body mass index is 23.73 kg/m.  Advanced Directives 07/22/2021 07/27/2020 07/16/2020 03/07/2020 07/10/2019 11/24/2018 02/24/2018  Does Patient Have a Medical Advance Directive? Yes Yes Yes Yes Yes No No  Type of Paramedic of [place];Living will [place];Living will [place];Living will [place];Living will [place];Living will - -  Does patient want to make changes to medical advance directive? - No - Patient declined - No - Patient declined - - -  Copy of [place] in Chart? No - copy requested - - - No - copy requested - -    Current Medications (verified) Outpatient Encounter Medications as of 07/22/2021  Medication Sig   Ascorbic Acid (VITAMIN C PO) Take 500 mg by mouth daily.    B Complex Vitamins (B COMPLEX 100 PO) Take 100 mg by mouth daily.   Blood Pressure Monitoring (OMRON 5 SERIES BP MONITOR) DEVI    Cholecalciferol (VITAMIN D-3 PO) Take 5,000 Units by mouth daily.    clonazePAM (KLONOPIN) 0.5 MG tablet TAKE 1 TABLET(0.5 MG) BY MOUTH DAILY AS NEEDED FOR ANXIETY   Coenzyme Q10 (CO Q 10 PO) Take 100 mg by mouth daily.    guaiFENesin-dextromethorphan  (ROBITUSSIN DM) 100-10 MG/5ML syrup Take 5 mLs by mouth every 4 (four) hours as needed for cough.   rosuvastatin (CRESTOR) 40 MG tablet TAKE 1 TABLET(40 MG) BY MOUTH DAILY   Acetylcarnitine HCl (ACETYL-L-CARNITINE HCL) POWD by Does not apply route. (Patient not taking: Reported on 07/22/2021)   amoxicillin-clavulanate (AUGMENTIN) 875-125 MG tablet Take 1 tablet by mouth 2 (two) times daily for 7 days. (Patient not taking: Reported on 07/22/2021)   benzonatate (TESSALON PERLES) 100 MG capsule Take 1 capsule (100 mg total) by mouth 3 (three) times daily as needed for cough. (Patient not taking: Reported on 07/22/2021)   ezetimibe (ZETIA) 10 MG tablet Take 1 tablet (10 mg total) by mouth daily.   No facility-administered encounter medications on file as of 07/22/2021.    Allergies (verified) Iodine and Sulfa antibiotics   History: Past Medical History:  Diagnosis Date   Anginal pain ([place]) 05/14/2008   atypical chest pain-- Exercise  Myoview  EF 61% ,mod to severe ischemia Basal Anterior ,Anteroseptal,Mid Anterior,Mid Anteroseptal, Apical Anterior and Apical Septal regions   LV normal    Coronary artery disease 06/23/2010   Echo EF =>55%,LV normal   Hyperlipidemia    Hypertension    Sleep apnea    Tremor, essential 02/19/2016   Past Surgical History:  Procedure Laterality Date   CARDIAC CATHETERIZATION  05/15/2008   2 areas of LAD   CORONARY ANGIOPLASTY WITH STENT PLACEMENT  05/15/2008    proximal LAD -MiniVision stent 2.5 x 12  and mid LAD 2 overlapping MiniVision 2.0 x 15   Family History  Problem Relation Age of Onset   Diabetes Mother    Hypertension Mother    Kidney failure Mother    Transient ischemic attack Father    Coronary artery disease Father    Dementia Father    Cancer Brother    Social History   Socioeconomic History   Marital status: Married    Spouse name: Hendricks Milo   Number of children: 2   Years of education: 12   Highest education level: Not on file  Occupational  History   Occupation: Retired    Fish farm manager: [employer]  Tobacco Use   Smoking status: Former    Packs/day: 1.00    Types: Cigarettes    Start date: 04/06/1970    Quit date: 12/16/1996    Years since quitting: 24.6   Smokeless tobacco: Never   Tobacco comments:    1ppd x 7 years,   Vaping Use   Vaping Use: Never used  Substance and Sexual Activity   Alcohol use: No    Alcohol/week: 0.0 standard drinks   Drug use: No   Sexual activity: Yes  Other Topics Concern   Not on file  Social History Narrative   Lives w/ husband   Right-handed   Drinks about 12 oz caffeinated beverage per day   Social Determinants of Health   Financial Resource Strain: Low Risk    Difficulty of Paying Living Expenses: Not hard at all  Food Insecurity: No Food Insecurity   Worried About Charity fundraiser in the Last Year: Never true   Arboriculturist in the Last Year: Never true  Transportation Needs: No Transportation Needs   Lack of Transportation (Medical): No   Lack of Transportation (Non-Medical): No  Physical Activity: Sufficiently Active   Days of Exercise per Week: 5 days   Minutes of Exercise per Session: 30 min  Stress: No Stress Concern Present   Feeling of Stress : Not at all  Social Connections: Not on file    Tobacco Counseling Counseling given: Not Answered Tobacco comments: 1ppd x 7 years,    Clinical Intake:  Pre-visit preparation completed: Yes  Pain : No/denies pain     Nutritional Status: BMI of 19-24  Normal Nutritional Risks: None Diabetes: No  How often do you need to have someone help you when you read instructions, pamphlets, or other written materials from your doctor or pharmacy?: 1 - Never What is the last grade level you completed in school?: 12th grade  Diabetic? no  Interpreter Needed?: No  Information entered by :: NAllen LPN   Activities of Daily Living In your present state of health, do you have any difficulty performing the following  activities: 07/22/2021  Hearing? N  Vision? N  Difficulty concentrating or making decisions? N  Walking or climbing stairs? N  Dressing or bathing? N  Doing errands, shopping? N  Preparing Food and eating ? N  Using the Toilet? N  In the past six months, have you accidently leaked urine? N  Do you have problems with loss of bowel control? N  Managing your Medications? N  Managing your Finances? N  Housekeeping or managing your Housekeeping? N  Some recent data might be hidden    Patient Care Team: Glendale Chard, MD as PCP - General (Internal Medicine)  Indicate any recent Medical Services you may have received from other than [facility] providers in the past year (  date may be approximate).     Assessment:   This is a routine wellness examination for Infant.  Hearing/Vision screen No results found.  Dietary issues and exercise activities discussed: Current Exercise Habits: Home exercise routine, Type of exercise: walking, Time (Minutes): 30, Frequency (Times/Week): 5, Weekly Exercise (Minutes/Week): 150   Goals Addressed             This Visit's Progress    Patient Stated       07/22/2021, decrease stress       Depression Screen PHQ 2/9 Scores 07/22/2021 07/16/2020 07/16/2020 07/10/2019 01/16/2019 11/28/2018 07/18/2018  PHQ - 2 Score 0 0 0 0 0 0 0  PHQ- 9 Score - - - 0 - - -    Fall Risk Fall Risk  07/22/2021 07/16/2020 07/16/2020 07/10/2019 01/16/2019  Falls in the past year? 0 0 0 0 0  Comment - - - - -  Number falls in past yr: - - - 0 -  Comment - - - - -  Injury with Fall? - - - - -  Risk for fall due to : Medication side effect Medication side effect - Medication side effect -  Follow up Falls evaluation completed;Education provided;Falls prevention discussed Falls evaluation completed;Education provided;Falls prevention discussed - Falls evaluation completed;Education provided;Falls prevention discussed -    FALL RISK PREVENTION PERTAINING TO THE HOME:  Any stairs in  or around the home? No  If so, are there any without handrails?  N/a Home free of loose throw rugs in walkways, pet beds, electrical cords, etc? Yes  Adequate lighting in your home to reduce risk of falls? Yes   ASSISTIVE DEVICES UTILIZED TO PREVENT FALLS:  Life alert? No  Use of a cane, walker or w/c? No  Grab bars in the bathroom? No  Shower chair or bench in shower? No  Elevated toilet seat or a handicapped toilet? Yes   TIMED UP AND GO:  Was the test performed? No .    Gait steady and fast without use of assistive device  Cognitive Function:     6CIT Screen 07/22/2021 07/16/2020 07/10/2019  What Year? 0 points 0 points 0 points  What month? 0 points 0 points 0 points  What time? 0 points 0 points 0 points  Count back from 20 0 points 0 points 0 points  Months in reverse 2 points 0 points 0 points  Repeat phrase 0 points 0 points 0 points  Total Score 2 0 0    Immunizations Immunization History  Administered Date(s) Administered   Pneumococcal Polysaccharide-23 07/19/2017   Tdap 02/24/2018    TDAP status: Up to date  Flu Vaccine status: Declined, Education has been provided regarding the importance of this vaccine but patient still declined. Advised may receive this vaccine at local pharmacy or Health Dept. Aware to provide a copy of the vaccination record if obtained from local pharmacy or Health Dept. Verbalized acceptance and understanding.  Pneumococcal vaccine status: Up to date  Covid-19 vaccine status: Declined, Education has been provided regarding the importance of this vaccine but patient still declined. Advised may receive this vaccine at local pharmacy or Health Dept.or vaccine clinic. Aware to provide a copy of the vaccination record if obtained from local pharmacy or Health Dept. Verbalized acceptance and understanding.  Qualifies for Shingles Vaccine? Yes   Zostavax completed No   Shingrix Completed?: No.    Education has been provided regarding the  importance of this vaccine. Patient has been advised to call  insurance company to determine out of pocket expense if they have not yet received this vaccine. Advised may also receive vaccine at local pharmacy or Health Dept. Verbalized acceptance and understanding.  Screening Tests Health Maintenance  Topic Date Due   Zoster Vaccines- Shingrix (1 of 2) Never done   INFLUENZA VACCINE  Never done   MAMMOGRAM  07/14/2021   COVID-19 Vaccine (1) 07/31/2021 (Originally 09/27/1951)   TETANUS/TDAP  02/25/2028   COLONOSCOPY (Pts 45-62yrs Insurance coverage will need to be confirmed)  05/30/2028   DEXA SCAN  Completed   Hepatitis C Screening  Completed   HPV VACCINES  Aged Out    Health Maintenance  Health Maintenance Due  Topic Date Due   Zoster Vaccines- Shingrix (1 of 2) Never done   INFLUENZA VACCINE  Never done   MAMMOGRAM  07/14/2021    Colorectal cancer screening: Type of screening: Colonoscopy. Completed 05/30/2018. Repeat every 10 years  Mammogram status:patient to schedule  Bone Density status: Completed 04/02/2017.  Lung Cancer Screening: (Low Dose CT Chest recommended if Age 33-80 years, 30 pack-year currently smoking OR have quit w/in 15years.) does not qualify.   Lung Cancer Screening Referral: no  Additional Screening:  Hepatitis C Screening: does qualify; Completed 07/27/2020  Vision Screening: Recommended annual ophthalmology exams for early detection of glaucoma and other disorders of the eye. Is the patient up to date with their annual eye exam?  Yes  Who is the provider or what is the name of the office in which the patient attends annual eye exams? Dr. Wyatt Portela If pt is not established with a provider, would they like to be referred to a provider to establish care? No .   Dental Screening: Recommended annual dental exams for proper oral hygiene  Community Resource Referral / Chronic Care Management: CRR required this visit?  No   CCM required this visit?  No       Plan:     I have personally reviewed and noted the following in the patient's chart:   Medical and social history Use of alcohol, tobacco or illicit drugs  Current medications and supplements including opioid prescriptions.  Functional ability and status Nutritional status Physical activity Advanced directives List of other physicians Hospitalizations, surgeries, and ER visits in previous 12 months Vitals Screenings to include cognitive, depression, and falls Referrals and appointments  In addition, I have reviewed and discussed with patient certain preventive protocols, quality metrics, and best practice recommendations. A written personalized care plan for preventive services as well as general preventive health recommendations were provided to patient.     Kellie Simmering, LPN   30/0/9233   Nurse Notes:

## 2021-07-23 LAB — CBC WITH DIFFERENTIAL/PLATELET
Basophils Absolute: 0.1 10*3/uL (ref 0.0–0.2)
Basos: 0 %
EOS (ABSOLUTE): 0.1 10*3/uL (ref 0.0–0.4)
Eos: 1 %
Hematocrit: 43.2 % (ref 34.0–46.6)
Hemoglobin: 14.1 g/dL (ref 11.1–15.9)
Immature Grans (Abs): 0 10*3/uL (ref 0.0–0.1)
Immature Granulocytes: 0 %
Lymphocytes Absolute: 11.1 10*3/uL — ABNORMAL HIGH (ref 0.7–3.1)
Lymphs: 69 %
MCH: 31.1 pg (ref 26.6–33.0)
MCHC: 32.6 g/dL (ref 31.5–35.7)
MCV: 95 fL (ref 79–97)
Monocytes Absolute: 0.5 10*3/uL (ref 0.1–0.9)
Monocytes: 3 %
Neutrophils Absolute: 4.3 10*3/uL (ref 1.4–7.0)
Neutrophils: 27 %
Platelets: 233 10*3/uL (ref 150–450)
RBC: 4.53 x10E6/uL (ref 3.77–5.28)
RDW: 11.9 % (ref 11.7–15.4)
WBC: 16.1 10*3/uL — ABNORMAL HIGH (ref 3.4–10.8)

## 2021-07-23 LAB — LIPID PANEL
Chol/HDL Ratio: 2.2 ratio (ref 0.0–4.4)
Cholesterol, Total: 141 mg/dL (ref 100–199)
HDL: 63 mg/dL (ref >39)
LDL Chol Calc (NIH): 61 mg/dL (ref 0–99)
Triglycerides: 88 mg/dL (ref 0–149)
VLDL Cholesterol Cal: 17 mg/dL (ref 5–40)

## 2021-07-23 LAB — CMP14+EGFR
ALT: 34 IU/L — ABNORMAL HIGH (ref 0–32)
AST: 29 IU/L (ref 0–40)
Albumin/Globulin Ratio: 2.3 — ABNORMAL HIGH (ref 1.2–2.2)
Albumin: 4.8 g/dL (ref 3.8–4.8)
Alkaline Phosphatase: 94 IU/L (ref 44–121)
BUN/Creatinine Ratio: 24 (ref 12–28)
BUN: 21 mg/dL (ref 8–27)
Bilirubin Total: 0.5 mg/dL (ref 0.0–1.2)
CO2: 25 mmol/L (ref 20–29)
Calcium: 9.9 mg/dL (ref 8.7–10.3)
Chloride: 105 mmol/L (ref 96–106)
Creatinine, Ser: 0.88 mg/dL (ref 0.57–1.00)
Globulin, Total: 2.1 g/dL (ref 1.5–4.5)
Glucose: 86 mg/dL (ref 70–99)
Potassium: 5 mmol/L (ref 3.5–5.2)
Sodium: 143 mmol/L (ref 134–144)
Total Protein: 6.9 g/dL (ref 6.0–8.5)
eGFR: 71 mL/min/{1.73_m2} (ref >59)

## 2021-07-26 ENCOUNTER — Encounter: Payer: Self-pay | Admitting: Internal Medicine

## 2021-07-27 LAB — T4, FREE: Free T4: 1.13 ng/dL (ref 0.82–1.77)

## 2021-07-27 LAB — SPECIMEN STATUS REPORT

## 2021-07-27 LAB — TSH: TSH: 0.843 u[IU]/mL (ref 0.450–4.500)

## 2021-09-24 ENCOUNTER — Ambulatory Visit
Admission: RE | Admit: 2021-09-24 | Discharge: 2021-09-24 | Disposition: A | Discharge status: Home / Self Care | Payer: Medicare Other | Source: Ambulatory Visit | Attending: Internal Medicine | Admitting: Internal Medicine

## 2021-09-24 DIAGNOSIS — Z1231 Encounter for screening mammogram for malignant neoplasm of breast: Secondary | ICD-10-CM | POA: Diagnosis not present

## 2021-11-22 ENCOUNTER — Encounter: Payer: Self-pay | Admitting: Internal Medicine

## 2021-11-25 ENCOUNTER — Encounter: Payer: Self-pay | Admitting: Internal Medicine

## 2021-11-26 DIAGNOSIS — Z03818 Encounter for observation for suspected exposure to other biological agents ruled out: Secondary | ICD-10-CM | POA: Diagnosis not present

## 2021-11-26 DIAGNOSIS — Z20822 Contact with and (suspected) exposure to covid-19: Secondary | ICD-10-CM | POA: Diagnosis not present

## 2021-12-06 ENCOUNTER — Other Ambulatory Visit: Payer: Self-pay | Admitting: Internal Medicine

## 2022-05-03 ENCOUNTER — Other Ambulatory Visit: Payer: Self-pay | Admitting: Internal Medicine

## 2022-05-06 ENCOUNTER — Other Ambulatory Visit: Payer: Self-pay | Admitting: Internal Medicine

## 2022-05-06 MED ORDER — CLONAZEPAM 0.5 MG PO TABS: 0.5000 mg | ORAL_TABLET | Freq: Every day | ORAL | 0 refills | Status: DC

## 2022-05-11 ENCOUNTER — Encounter: Payer: Self-pay | Admitting: Internal Medicine

## 2022-05-12 ENCOUNTER — Telehealth (INDEPENDENT_AMBULATORY_CARE_PROVIDER_SITE_OTHER): Payer: Medicare Other | Admitting: Internal Medicine

## 2022-05-12 ENCOUNTER — Encounter: Payer: Self-pay | Admitting: Internal Medicine

## 2022-05-12 VITALS — BP 120/66 | HR 69 | Ht 62.4 in | Wt 128.0 lb

## 2022-05-12 DIAGNOSIS — Z79899 Other long term (current) drug therapy: Secondary | ICD-10-CM | POA: Diagnosis not present

## 2022-05-12 DIAGNOSIS — E7800 Pure hypercholesterolemia, unspecified: Secondary | ICD-10-CM | POA: Diagnosis not present

## 2022-05-12 DIAGNOSIS — F419 Anxiety disorder, unspecified: Secondary | ICD-10-CM

## 2022-05-12 MED ORDER — CLONAZEPAM 0.5 MG PO TABS: 0.5000 mg | ORAL_TABLET | Freq: Every day | ORAL | 0 refills | Status: DC

## 2022-05-12 NOTE — Progress Notes (Addendum)
Virtual Visit via Phone   This visit type was conducted due to national recommendations for restrictions regarding the COVID-19 Pandemic (e.g. social distancing) in an effort to limit this patient's exposure and mitigate transmission in our community.  Due to her Martin-morbid illnesses, this patient is at least at moderate risk for complications without adequate follow up.  This format is felt to be most appropriate for this patient at this time.  All issues noted in this document were discussed and addressed.  A limited physical exam was performed with this format.    This visit type was conducted due to national recommendations for restrictions regarding the COVID-19 Pandemic (e.g. social distancing) in an effort to limit this patient's exposure and mitigate transmission in our community.  Patients identity confirmed using two different identifiers.  This format is felt to be most appropriate for this patient at this time.  All issues noted in this document were discussed and addressed.  No physical exam was performed (except for noted visual exam findings with Video Visits).    Date:  05/13/2022   ID:  Carolyn Martin, DOB [birth year], MRN [id number]  Patient Location:  Home  Provider location:   Office    Chief Complaint:  "I need a refill of my meds"  History of Present Illness:    Carolyn Martin is a 71 y.o. female who presents via video conferencing for a telehealth visit today.    The patient does not have symptoms concerning for COVID-19 infection (fever, chills, cough, or new shortness of breath).   She presents today for virtual visit. She prefers this method of contact due to COVID-19 pandemic.  She would like to refill her medication. She needs clonazepam to help her with sleep. She has no other concerns. She spends a lot of time caring for her 3 grandchildren. She states they keep her active. She denies chest pain, shortness of breath and headaches.        Past Medical  History:  Diagnosis Date   Anginal pain ([place]) 05/14/2008   atypical chest pain-- Exercise  Myoview  EF 61% ,mod to severe ischemia Basal Anterior ,Anteroseptal,Mid Anterior,Mid Anteroseptal, Apical Anterior and Apical Septal regions   LV normal    Coronary artery disease 06/23/2010   Echo EF =>55%,LV normal   Hyperlipidemia    Hypertension    Sleep apnea    Tremor, essential 02/19/2016   Past Surgical History:  Procedure Laterality Date   CARDIAC CATHETERIZATION  05/15/2008   2 areas of LAD   CORONARY ANGIOPLASTY WITH STENT PLACEMENT  05/15/2008    proximal LAD -MiniVision stent 2.5 x 12 and mid LAD 2 overlapping MiniVision 2.0 x 15     Current Meds  Medication Sig   Ascorbic Acid (VITAMIN C PO) Take 500 mg by mouth daily.    B Complex Vitamins (B COMPLEX 100 PO) Take 100 mg by mouth daily.   Blood Pressure Monitoring (OMRON 5 SERIES BP MONITOR) DEVI    Cholecalciferol (VITAMIN D-3 PO) Take 5,000 Units by mouth daily.    Coenzyme Q10 (Martin Q 10 PO) Take 100 mg by mouth daily.    magnesium oxide (MAG-OX) 400 (240 Mg) MG tablet Take 400 mg by mouth daily.   rosuvastatin (CRESTOR) 40 MG tablet TAKE 1 TABLET(40 MG) BY MOUTH DAILY   [DISCONTINUED] clonazePAM (KLONOPIN) 0.5 MG tablet Take 1 tablet (0.5 mg total) by mouth daily.     Allergies:   Iodine and Sulfa antibiotics  Social History   Tobacco Use   Smoking status: Former    Packs/day: 1.00    Types: Cigarettes    Start date: 04/06/1970    Quit date: 12/16/1996    Years since quitting: 25.4   Smokeless tobacco: Never   Tobacco comments:    1ppd x 7 years,   Vaping Use   Vaping Use: Never used  Substance Use Topics   Alcohol use: No    Alcohol/week: 0.0 standard drinks of alcohol   Drug use: No     Family Hx: The patient's family history includes Cancer in her brother; Coronary artery disease in her father; Dementia in her father; Diabetes in her mother; Hypertension in her mother; Kidney failure in her mother; Transient  ischemic attack in her father.  ROS:   Please see the history of present illness.    Review of Systems  Constitutional: Negative.   Respiratory: Negative.    Cardiovascular: Negative.   Gastrointestinal: Negative.   Neurological: Negative.   Psychiatric/Behavioral: Negative.      All other systems reviewed and are negative.   Labs/Other Tests and Data Reviewed:    Recent Labs: 07/22/2021: ALT 34; BUN 21; Creatinine, Ser 0.88; Hemoglobin 14.1; Platelets 233; Potassium 5.0; Sodium 143; TSH 0.843   Recent Lipid Panel Lab Results  Component Value Date/Time   CHOL 141 07/22/2021 09:56 AM   TRIG 88 07/22/2021 09:56 AM   HDL 63 07/22/2021 09:56 AM   CHOLHDL 2.2 07/22/2021 09:56 AM   LDLCALC 61 07/22/2021 09:56 AM    Wt Readings from Last 3 Encounters:  05/12/22 128 lb (58.1 kg)  07/22/21 131 lb 6.3 oz (59.6 kg)  07/22/21 131 lb 6.4 oz (59.6 kg)     Exam:    Vital Signs:  BP 120/66   Pulse 69   Ht 5' 2.4" (1.585 m)   Wt 128 lb (58.1 kg)   BMI 23.11 kg/m     Physical Exam Vitals and nursing note reviewed.  HENT:     Head: Normocephalic and atraumatic.  Pulmonary:     Effort: Pulmonary effort is normal.  Neurological:     Mental Status: She is alert and oriented to person, place, and time.  Psychiatric:        Mood and Affect: Affect normal.     ASSESSMENT & PLAN:    1. Anxiety Comments: PDMP reviewed, I will send refill of clonazepam to use nightly prn. she ise ncouraged to consider magnesium nightly.   2. Pure hypercholesterolemia Comments: Chronic, currently on rosuvastatin '40mg'$  daily. She is encouraged to keep upcoming Nov appt.   3. Drug therapy    COVID-19 Education: The signs and symptoms of COVID-19 were discussed with the patient and how to seek care for testing (follow up with PCP or arrange E-visit).  The importance of social distancing was discussed today.  Patient Risk:   After full review of this patients clinical status, I feel that they  are at least moderate risk at this time.  Time:   Today, I have spent 12 minutes with the patient with telehealth technology discussing above diagnoses.   THIS IS A FAILED VIDEO CALL, I WAS ABLE TO CONNECT WITH PATIENT, HOWEVER, CALL IMMEDIATELY DISCONNECTED ONCE I GOT ON THE CALL ON THREE OCCASIONS. SHE ADMITS SHE DOES NOT HAVE WIFI AT HOME. I THEN CONTACTED HER BY PHONE AND CONFIRMED HER IDENTITY WITH TWO IDENTIFIERS.   Medication Adjustments/Labs and Tests Ordered: Current medicines are reviewed at length with the  patient today.  Concerns regarding medicines are outlined above.   Tests Ordered: No orders of the defined types were placed in this encounter.   Medication Changes: Meds ordered this encounter  Medications   clonazePAM (KLONOPIN) 0.5 MG tablet    Sig: Take 1 tablet (0.5 mg total) by mouth daily.    Dispense:  30 tablet    Refill:  0    Disposition:  Follow up prn  Signed, [name], MD

## 2022-05-12 NOTE — Patient Instructions (Signed)
Insomnia Insomnia is a sleep disorder that makes it difficult to fall asleep or stay asleep. Insomnia can cause fatigue, low energy, difficulty concentrating, mood swings, and poor performance at work or school. There are three different ways to classify insomnia: Difficulty falling asleep. Difficulty staying asleep. Waking up too early in the morning. Any type of insomnia can be long-term (chronic) or short-term (acute). Both are common. Short-term insomnia usually lasts for 3 months or less. Chronic insomnia occurs at least three times a week for longer than 3 months. What are the causes? Insomnia may be caused by another condition, situation, or substance, such as: Having certain mental health conditions, such as anxiety and depression. Using caffeine, alcohol, tobacco, or drugs. Having gastrointestinal conditions, such as gastroesophageal reflux disease (GERD). Having certain medical conditions. These include: Asthma. Alzheimer's disease. Stroke. Chronic pain. An overactive thyroid gland (hyperthyroidism). Other sleep disorders, such as restless legs syndrome and sleep apnea. Menopause. Sometimes, the cause of insomnia may not be known. What increases the risk? Risk factors for insomnia include: Gender. Females are affected more often than males. Age. Insomnia is more common as people get older. Stress and certain medical and mental health conditions. Lack of exercise. Having an irregular work schedule. This may include working night shifts and traveling between different time zones. What are the signs or symptoms? If you have insomnia, the main symptom is having trouble falling asleep or having trouble staying asleep. This may lead to other symptoms, such as: Feeling tired or having low energy. Feeling nervous about going to sleep. Not feeling rested in the morning. Having trouble concentrating. Feeling irritable, anxious, or depressed. How is this diagnosed? This condition  may be diagnosed based on: Your symptoms and medical history. Your health care provider may ask about: Your sleep habits. Any medical conditions you have. Your mental health. A physical exam. How is this treated? Treatment for insomnia depends on the cause. Treatment may focus on treating an underlying condition that is causing the insomnia. Treatment may also include: Medicines to help you sleep. Counseling or therapy. Lifestyle adjustments to help you sleep better. Follow these instructions at home: Eating and drinking  Limit or avoid alcohol, caffeinated beverages, and products that contain nicotine and tobacco, especially close to bedtime. These can disrupt your sleep. Do not eat a large meal or eat spicy foods right before bedtime. This can lead to digestive discomfort that can make it hard for you to sleep. Sleep habits  Keep a sleep diary to help you and your health care provider figure out what could be causing your insomnia. Write down: When you sleep. When you wake up during the night. How well you sleep and how rested you feel the next day. Any side effects of medicines you are taking. What you eat and drink. Make your bedroom a dark, comfortable place where it is easy to fall asleep. Put up shades or blackout curtains to block light from outside. Use a white noise machine to block noise. Keep the temperature cool. Limit screen use before bedtime. This includes: Not watching TV. Not using your smartphone, tablet, or computer. Stick to a routine that includes going to bed and waking up at the same times every day and night. This can help you fall asleep faster. Consider making a quiet activity, such as reading, part of your nighttime routine. Try to avoid taking naps during the day so that you sleep better at night. Get out of bed if you are still awake after   15 minutes of trying to sleep. Keep the lights down, but try reading or doing a quiet activity. When you feel  sleepy, go back to bed. General instructions Take over-the-counter and prescription medicines only as told by your health care provider. Exercise regularly as told by your health care provider. However, avoid exercising in the hours right before bedtime. Use relaxation techniques to manage stress. Ask your health care provider to suggest some techniques that may work well for you. These may include: Breathing exercises. Routines to release muscle tension. Visualizing peaceful scenes. Make sure that you drive carefully. Do not drive if you feel very sleepy. Keep all follow-up visits. This is important. Contact a health care provider if: You are tired throughout the day. You have trouble in your daily routine due to sleepiness. You continue to have sleep problems, or your sleep problems get worse. Get help right away if: You have thoughts about hurting yourself or someone else. Get help right away if you feel like you may hurt yourself or others, or have thoughts about taking your own life. Go to your nearest emergency room or: Call 911. Call the National Suicide Prevention Lifeline at 1-800-273-8255 or 988. This is open 24 hours a day. Text the Crisis Text Line at 741741. Summary Insomnia is a sleep disorder that makes it difficult to fall asleep or stay asleep. Insomnia can be long-term (chronic) or short-term (acute). Treatment for insomnia depends on the cause. Treatment may focus on treating an underlying condition that is causing the insomnia. Keep a sleep diary to help you and your health care provider figure out what could be causing your insomnia. This information is not intended to replace advice given to you by your health care provider. Make sure you discuss any questions you have with your health care provider. Document Revised: 09/13/2021 Document Reviewed: 09/13/2021 Elsevier Patient Education  2023 Elsevier Inc.  

## 2022-05-18 DIAGNOSIS — L508 Other urticaria: Secondary | ICD-10-CM | POA: Diagnosis not present

## 2022-05-25 ENCOUNTER — Telehealth: Payer: Self-pay | Admitting: Oncology

## 2022-05-25 NOTE — Telephone Encounter (Signed)
Patient called and would like to schedule an appointment with Dr. Tasia Catchings. It has been since 2021 Oct since she has been seen.  Is this something I need to schedule or is it a new patient?

## 2022-05-26 NOTE — Telephone Encounter (Signed)
Please advise 

## 2022-05-27 ENCOUNTER — Other Ambulatory Visit: Payer: Self-pay

## 2022-05-27 DIAGNOSIS — D72820 Lymphocytosis (symptomatic): Secondary | ICD-10-CM

## 2022-05-27 NOTE — Telephone Encounter (Signed)
Please call pt to set up appts:   Labs - just next available spot (non urgent) MD 1 week after labs

## 2022-06-08 ENCOUNTER — Inpatient Hospital Stay: Payer: Medicare Other | Attending: Oncology

## 2022-06-08 DIAGNOSIS — Z87891 Personal history of nicotine dependence: Secondary | ICD-10-CM | POA: Insufficient documentation

## 2022-06-08 DIAGNOSIS — D72820 Lymphocytosis (symptomatic): Secondary | ICD-10-CM

## 2022-06-08 DIAGNOSIS — D72829 Elevated white blood cell count, unspecified: Secondary | ICD-10-CM | POA: Insufficient documentation

## 2022-06-08 LAB — COMPREHENSIVE METABOLIC PANEL
ALT: 18 U/L (ref 0–44)
AST: 20 U/L (ref 15–41)
Albumin: 4.2 g/dL (ref 3.5–5.0)
Alkaline Phosphatase: 58 U/L (ref 38–126)
Anion gap: 8 (ref 5–15)
BUN: 23 mg/dL (ref 8–23)
CO2: 27 mmol/L (ref 22–32)
Calcium: 9.3 mg/dL (ref 8.9–10.3)
Chloride: 103 mmol/L (ref 98–111)
Creatinine, Ser: 0.99 mg/dL (ref 0.44–1.00)
GFR, Estimated: >60 mL/min (ref >60)
Glucose, Bld: 90 mg/dL (ref 70–99)
Potassium: 3.9 mmol/L (ref 3.5–5.1)
Sodium: 138 mmol/L (ref 135–145)
Total Bilirubin: 0.8 mg/dL (ref 0.3–1.2)
Total Protein: 7 g/dL (ref 6.5–8.1)

## 2022-06-08 LAB — CBC WITH DIFFERENTIAL/PLATELET
Abs Immature Granulocytes: 0.02 10*3/uL (ref 0.00–0.07)
Basophils Absolute: 0.1 10*3/uL (ref 0.0–0.1)
Basophils Relative: 1 %
Eosinophils Absolute: 0.1 10*3/uL (ref 0.0–0.5)
Eosinophils Relative: 1 %
HCT: 41 % (ref 36.0–46.0)
Hemoglobin: 13.5 g/dL (ref 12.0–15.0)
Immature Granulocytes: 0 %
Lymphocytes Relative: 69 %
Lymphs Abs: 9.2 10*3/uL — ABNORMAL HIGH (ref 0.7–4.0)
MCH: 31.6 pg (ref 26.0–34.0)
MCHC: 32.9 g/dL (ref 30.0–36.0)
MCV: 96 fL (ref 80.0–100.0)
Monocytes Absolute: 0.6 10*3/uL (ref 0.1–1.0)
Monocytes Relative: 5 %
Neutro Abs: 3.2 10*3/uL (ref 1.7–7.7)
Neutrophils Relative %: 24 %
Platelets: 210 10*3/uL (ref 150–400)
RBC: 4.27 MIL/uL (ref 3.87–5.11)
RDW: 12.7 % (ref 11.5–15.5)
WBC: 13.2 10*3/uL — ABNORMAL HIGH (ref 4.0–10.5)
nRBC: 0 % (ref 0.0–0.2)

## 2022-06-08 LAB — LACTATE DEHYDROGENASE: LDH: 101 U/L (ref 98–192)

## 2022-06-10 LAB — COMP PANEL: LEUKEMIA/LYMPHOMA: Immunophenotypic Profile: 56

## 2022-06-15 ENCOUNTER — Inpatient Hospital Stay: Payer: Medicare Other | Admitting: Oncology

## 2022-07-14 ENCOUNTER — Encounter: Payer: Self-pay | Admitting: Oncology

## 2022-07-14 ENCOUNTER — Inpatient Hospital Stay: Payer: Medicare Other | Attending: Oncology | Admitting: Oncology

## 2022-07-14 VITALS — BP 141/83 | HR 72 | Temp 98.5°F | Resp 18 | Wt 122.4 lb

## 2022-07-14 DIAGNOSIS — Z79899 Other long term (current) drug therapy: Secondary | ICD-10-CM | POA: Insufficient documentation

## 2022-07-14 DIAGNOSIS — Z7189 Other specified counseling: Secondary | ICD-10-CM | POA: Diagnosis not present

## 2022-07-14 DIAGNOSIS — C9110 Chronic lymphocytic leukemia of B-cell type not having achieved remission: Secondary | ICD-10-CM | POA: Diagnosis not present

## 2022-07-14 DIAGNOSIS — Z87891 Personal history of nicotine dependence: Secondary | ICD-10-CM | POA: Insufficient documentation

## 2022-07-14 NOTE — Assessment & Plan Note (Addendum)
06/08/22 peripheral blood flowcytometry  Chronic lymphocytic leukemia (CLL), positive for CD20, CD22 and CD19, negative for CD38 New diagnosis was discussed with patient. No constitutional symptoms. No anemia, thrombocytopenia, RAI Stage 0 Recommend watchful waiting.  Check CLL FISH panel and IGVH hypermutation

## 2022-07-14 NOTE — Assessment & Plan Note (Signed)
Discussed with patient

## 2022-07-14 NOTE — Progress Notes (Signed)
Hematology/Oncology Progress note [phone] [phone]      Patient Care Team: Glendale Chard, MD as PCP - General (Internal Medicine)  ASSESSMENT & PLAN:   Cancer Staging  CLL (chronic lymphocytic leukemia) ([place]) Staging form: Chronic Lymphocytic Leukemia / Small Lymphocytic Lymphoma, AJCC 8th Edition - Clinical stage from 07/14/2022: Modified Rai Stage 0 (Modified Rai risk: Low, Lymphocytosis: Present, Adenopathy: Absent, Organomegaly: Absent, Anemia: Absent, Thrombocytopenia: Absent) - Signed by Earlie Server, MD on 07/14/2022   CLL (chronic lymphocytic leukemia) ([place]) 06/08/22 peripheral blood flowcytometry  Chronic lymphocytic leukemia (CLL), positive for CD20, CD22 and CD19, negative for CD38 New diagnosis was discussed with patient. No constitutional symptoms. No anemia, thrombocytopenia, RAI Stage 0 Recommend watchful waiting.  Check CLL FISH panel and IGVH hypermutation  Goals of care, counseling/discussion Discussed with patient.    Orders Placed This Encounter  Procedures   Miscellaneous [facility] test (send-out)    Standing Status:   Future    Standing Expiration Date:   07/15/2023    Order Specific Question:   Test name / description:    Answer:   IGVH somatic hypermutation [facility] [id number]   FISH HES [id number] REA    Standing Status:   Future    Standing Expiration Date:   07/15/2023   CBC with Differential/Platelet    Standing Status:   Future    Standing Expiration Date:   07/15/2023   Comprehensive metabolic panel    Standing Status:   Future    Standing Expiration Date:   07/14/2023   Lactate dehydrogenase    Standing Status:   Future    Standing Expiration Date:   07/15/2023   Follow up in 4 months. All questions were answered. The patient knows to call the clinic with any problems, questions or concerns.  Earlie Server, MD, PhD [facility] Hematology Oncology 07/14/2022   CHIEF COMPLAINTS/REASON FOR VISIT:  Follow up of  leukocytosis  HISTORY OF PRESENTING ILLNESS:  Carolyn Martin is a  71 y.o.  female with PMH listed below who was referred to me for evaluation of leukocytosis Reviewed patient' recent labs obtained by PCP.   07/16/2020 CBC showed elevated white count of 9.6, lymphocyte 6.7. Previous lab records reviewed. Leukocytosis onset of chronic, duration is since at least September 2020.  No aggravating or elevated factors. Associated symptoms or signs:  Denies weight loss, fever, chills,night sweats.  Denies fatigue Smoking history: She is a former smoker.  She quit smoking 2008. History of recent oral steroid use or steroid injection: Denies History of recent infection: Patient had recent COVID-19 infection in September 2021.  Patient got Mab infusion. Autoimmune disease history.  Denies   She reports that her mom has a history of CLL  INTERVAL HISTORY Carolyn Martin is a 71 y.o. female who has above history reviewed by me today presents for follow up visit for management of Lymphocytosis She was last seen on 08/10/20. History of monoclonal lymphocytosis.  She has intentionally weight loss, she eats 2 meals per day and has watched her diet.  She denies night sweats, fever + CAD, s/p stenting.    Review of Systems  Constitutional:  Negative for appetite change, chills, fatigue and fever.  HENT:   Negative for hearing loss and voice change.   Eyes:  Negative for eye problems.  Respiratory:  Negative for chest tightness and cough.   Cardiovascular:  Negative for chest pain.  Gastrointestinal:  Negative for abdominal distention, abdominal pain and blood in stool.  Endocrine: Negative for hot flashes.  Genitourinary:  Negative for difficulty urinating and frequency.   Musculoskeletal:  Negative for arthralgias.  Skin:  Negative for itching and rash.  Neurological:  Negative for extremity weakness.  Hematological:  Negative for adenopathy.  Psychiatric/Behavioral:  Negative for confusion.       MEDICAL HISTORY:  Past Medical History:  Diagnosis Date   Anginal pain ([place]) 05/14/2008   atypical chest pain-- Exercise  Myoview  EF 61% ,mod to severe ischemia Basal Anterior ,Anteroseptal,Mid Anterior,Mid Anteroseptal, Apical Anterior and Apical Septal regions   LV normal    Coronary artery disease 06/23/2010   Echo EF =>55%,LV normal   Hyperlipidemia    Hypertension    Sleep apnea    Tremor, essential 02/19/2016    SURGICAL HISTORY: Past Surgical History:  Procedure Laterality Date   CARDIAC CATHETERIZATION  05/15/2008   2 areas of LAD   CORONARY ANGIOPLASTY WITH STENT PLACEMENT  05/15/2008    proximal LAD -MiniVision stent 2.5 x 12 and mid LAD 2 overlapping MiniVision 2.0 x 15    SOCIAL HISTORY: Social History   Socioeconomic History   Marital status: Married    Spouse name: Scientist, research (medical)   Number of children: 2   Years of education: 12   Highest education level: Not on file  Occupational History   Occupation: Retired    Fish farm manager: [place] [employer]  Tobacco Use   Smoking status: Former    Packs/day: 1.00    Types: Cigarettes    Start date: 04/06/1970    Quit date: 12/16/1996    Years since quitting: 25.5   Smokeless tobacco: Never   Tobacco comments:    1ppd x 7 years,   Vaping Use   Vaping Use: Never used  Substance and Sexual Activity   Alcohol use: No    Alcohol/week: 0.0 standard drinks of alcohol   Drug use: No   Sexual activity: Yes  Other Topics Concern   Not on file  Social History Narrative   Lives w/ husband   Right-handed   Drinks about 12 oz caffeinated beverage per day   Social Determinants of Health   Financial Resource Strain: Low Risk  (07/22/2021)   Overall Financial Resource Strain (CARDIA)    Difficulty of Paying Living Expenses: Not hard at all  Food Insecurity: No Food Insecurity (07/22/2021)   Hunger Vital Sign    Worried About Estate manager/land agent of Food in the Last Year: Never true    Ran Out of Food in the Last Year: Never true   Transportation Needs: No Transportation Needs (07/22/2021)   PRAPARE - Hydrologist (Medical): No    Lack of Transportation (Non-Medical): No  Physical Activity: Sufficiently Active (07/22/2021)   Exercise Vital Sign    Days of Exercise per Week: 5 days    Minutes of Exercise per Session: 30 min  Stress: No Stress Concern Present (07/22/2021)   [place]    Feeling of Stress : Not at all  Social Connections: Not on file  Intimate Partner Violence: Not At Risk (07/10/2019)   Humiliation, Afraid, Rape, and Kick questionnaire    Fear of Current or Ex-Partner: No    Emotionally Abused: No    Physically Abused: No    Sexually Abused: No    FAMILY HISTORY: Family History  Problem Relation Age of Onset   Diabetes Mother    Hypertension Mother    Kidney failure Mother  Transient ischemic attack Father    Coronary artery disease Father    Dementia Father    Cancer Brother     ALLERGIES:  is allergic to iodine and sulfa antibiotics.  MEDICATIONS:  Current Outpatient Medications  Medication Sig Dispense Refill   Ascorbic Acid (VITAMIN C PO) Take 500 mg by mouth daily.      B Complex Vitamins (B COMPLEX 100 PO) Take 100 mg by mouth daily.     Blood Pressure Monitoring (OMRON 5 SERIES BP MONITOR) DEVI      Cholecalciferol (VITAMIN D-3 PO) Take 5,000 Units by mouth daily.      clonazePAM (KLONOPIN) 0.5 MG tablet Take 1 tablet (0.5 mg total) by mouth daily. 30 tablet 0   Coenzyme Q10 (CO Q 10 PO) Take 100 mg by mouth daily.      magnesium oxide (MAG-OX) 400 (240 Mg) MG tablet Take 400 mg by mouth daily.     rosuvastatin (CRESTOR) 40 MG tablet TAKE 1 TABLET(40 MG) BY MOUTH DAILY 90 tablet 3   No current facility-administered medications for this visit.     PHYSICAL EXAMINATION: ECOG PERFORMANCE STATUS: 0 - Asymptomatic Vitals:   07/14/22 1123  BP: (!) 141/83  Pulse: 72  Resp: 18   Temp: 98.5 F (36.9 C)   Filed Weights   07/14/22 1123  Weight: 122 lb 6.4 oz (55.5 kg)    Physical Exam Constitutional:      General: She is not in acute distress. HENT:     Head: Normocephalic and atraumatic.  Eyes:     General: No scleral icterus. Cardiovascular:     Rate and Rhythm: Normal rate.  Pulmonary:     Effort: Pulmonary effort is normal. No respiratory distress.     Breath sounds: No wheezing.  Abdominal:     General: Bowel sounds are normal. There is no distension.     Palpations: Abdomen is soft.     Tenderness: There is no abdominal tenderness.  Musculoskeletal:        General: No deformity. Normal range of motion.     Cervical back: Normal range of motion and neck supple.  Skin:    General: Skin is warm and dry.  Neurological:     Mental Status: She is alert and oriented to person, place, and time. Mental status is at baseline.     Cranial Nerves: No cranial nerve deficit.  Psychiatric:        Mood and Affect: Mood normal.     Labs     Latest Ref Rng & Units 06/08/2022    8:57 AM 07/22/2021    9:56 AM 07/27/2020   11:24 AM  CBC  WBC 4.0 - 10.5 K/uL 13.2  16.1  12.6   Hemoglobin 12.0 - 15.0 g/dL 13.5  14.1  13.6   Hematocrit 36.0 - 46.0 % 41.0  43.2  40.4   Platelets 150 - 400 K/uL 210  233  192       Latest Ref Rng & Units 06/08/2022    8:57 AM 07/22/2021    9:56 AM 01/04/2021    3:57 PM  CMP  Glucose 70 - 99 mg/dL 90  86  85   BUN 8 - 23 mg/dL '23  21  25   '$ Creatinine 0.44 - 1.00 mg/dL 0.99  0.88  0.94   Sodium 135 - 145 mmol/L 138  143  142   Potassium 3.5 - 5.1 mmol/L 3.9  5.0  4.2   Chloride 98 -  111 mmol/L 103  105  103   CO2 22 - 32 mmol/L '27  25  24   '$ Calcium 8.9 - 10.3 mg/dL 9.3  9.9  9.3   Total Protein 6.5 - 8.1 g/dL 7.0  6.9    Total Bilirubin 0.3 - 1.2 mg/dL 0.8  0.5    Alkaline Phos 38 - 126 U/L 58  94    AST 15 - 41 U/L 20  29    ALT 0 - 44 U/L 18  34  25     RADIOGRAPHIC STUDIES: I have personally reviewed the  radiological images as listed and agreed with the findings in the report. No results found.  LABORATORY DATA:  I have reviewed the data as listed    Latest Ref Rng & Units 06/08/2022    8:57 AM 07/22/2021    9:56 AM 07/27/2020   11:24 AM  CBC  WBC 4.0 - 10.5 K/uL 13.2  16.1  12.6   Hemoglobin 12.0 - 15.0 g/dL 13.5  14.1  13.6   Hematocrit 36.0 - 46.0 % 41.0  43.2  40.4   Platelets 150 - 400 K/uL 210  233  192       Latest Ref Rng & Units 06/08/2022    8:57 AM 07/22/2021    9:56 AM 01/04/2021    3:57 PM  CMP  Glucose 70 - 99 mg/dL 90  86  85   BUN 8 - 23 mg/dL '23  21  25   '$ Creatinine 0.44 - 1.00 mg/dL 0.99  0.88  0.94   Sodium 135 - 145 mmol/L 138  143  142   Potassium 3.5 - 5.1 mmol/L 3.9  5.0  4.2   Chloride 98 - 111 mmol/L 103  105  103   CO2 22 - 32 mmol/L '27  25  24   '$ Calcium 8.9 - 10.3 mg/dL 9.3  9.9  9.3   Total Protein 6.5 - 8.1 g/dL 7.0  6.9    Total Bilirubin 0.3 - 1.2 mg/dL 0.8  0.5    Alkaline Phos 38 - 126 U/L 58  94    AST 15 - 41 U/L 20  29    ALT 0 - 44 U/L 18  34  25

## 2022-08-31 ENCOUNTER — Ambulatory Visit: Payer: Medicare Other

## 2022-08-31 ENCOUNTER — Encounter: Payer: Federal, State, Local not specified - PPO | Admitting: Internal Medicine

## 2022-08-31 NOTE — Progress Notes (Deleted)
    Subjective:     Patient ID: Carolyn Martin , female    DOB: 1951/01/10 , 71 y.o.   MRN: [id number]   Chief Complaint  Patient presents with   Hypertension    HPI  Patient presents today for a bp and chol check, patient states compliance with medications and has no other concerns today.   Hypertension     Past Medical History:  Diagnosis Date   Anginal pain ([place]) 05/14/2008   atypical chest pain-- Exercise  Myoview  EF 61% ,mod to severe ischemia Basal Anterior ,Anteroseptal,Mid Anterior,Mid Anteroseptal, Apical Anterior and Apical Septal regions   LV normal    Coronary artery disease 06/23/2010   Echo EF =>55%,LV normal   Hyperlipidemia    Hypertension    Sleep apnea    Tremor, essential 02/19/2016     Family History  Problem Relation Age of Onset   Diabetes Mother    Hypertension Mother    Kidney failure Mother    Transient ischemic attack Father    Coronary artery disease Father    Dementia Father    Cancer Brother      Current Outpatient Medications:    Ascorbic Acid (VITAMIN C PO), Take 500 mg by mouth daily. , Disp: , Rfl:    B Complex Vitamins (B COMPLEX 100 PO), Take 100 mg by mouth daily., Disp: , Rfl:    Blood Pressure Monitoring (OMRON 5 SERIES BP MONITOR) DEVI, , Disp: , Rfl:    Cholecalciferol (VITAMIN D-3 PO), Take 5,000 Units by mouth daily. , Disp: , Rfl:    clonazePAM (KLONOPIN) 0.5 MG tablet, Take 1 tablet (0.5 mg total) by mouth daily., Disp: 30 tablet, Rfl: 0   Coenzyme Q10 (CO Q 10 PO), Take 100 mg by mouth daily. , Disp: , Rfl:    magnesium oxide (MAG-OX) 400 (240 Mg) MG tablet, Take 400 mg by mouth daily., Disp: , Rfl:    rosuvastatin (CRESTOR) 40 MG tablet, TAKE 1 TABLET(40 MG) BY MOUTH DAILY, Disp: 90 tablet, Rfl: 3   Allergies  Allergen Reactions   Iodine Hives   Sulfa Antibiotics      Review of Systems  Constitutional: Negative.   HENT: Negative.    Eyes: Negative.   Respiratory: Negative.    Cardiovascular: Negative.    Gastrointestinal: Negative.      There were no vitals filed for this visit. There is no height or weight on file to calculate BMI.   Objective:  Physical Exam      Assessment And Plan:     1. Essential hypertension  2. Pure hypercholesterolemia     Patient was given opportunity to ask questions. Patient verbalized understanding of the plan and was able to repeat key elements of the plan. All questions were answered to their satisfaction.  Tonie Griffith, [place], Tonie Griffith, CMA, have reviewed all documentation for this visit. The documentation on 08/31/22 for the exam, diagnosis, procedures, and orders are all accurate and complete.   IF YOU HAVE BEEN REFERRED TO A SPECIALIST, IT MAY TAKE 1-2 WEEKS TO SCHEDULE/PROCESS THE REFERRAL. IF YOU HAVE NOT HEARD FROM US/SPECIALIST IN TWO WEEKS, PLEASE GIVE [place] A CALL AT [phone].   THE PATIENT IS ENCOURAGED TO PRACTICE SOCIAL DISTANCING DUE TO THE COVID-19 PANDEMIC.

## 2022-09-02 ENCOUNTER — Ambulatory Visit (INDEPENDENT_AMBULATORY_CARE_PROVIDER_SITE_OTHER): Payer: Medicare Other

## 2022-09-02 VITALS — BP 107/68 | HR 74 | Temp 98.4°F | Ht 62.0 in | Wt 121.0 lb

## 2022-09-02 DIAGNOSIS — Z0000 Encounter for general adult medical examination without abnormal findings: Secondary | ICD-10-CM

## 2022-09-02 NOTE — Progress Notes (Signed)
No show

## 2022-09-02 NOTE — Progress Notes (Signed)
Subjective:   Carolyn Martin is a 71 y.o. female who presents for Medicare Annual (Subsequent) preventive examination.  Review of Systems           Objective:    There were no vitals filed for this visit. There is no height or weight on file to calculate BMI.     07/14/2022   11:19 AM 07/22/2021    9:02 AM 07/27/2020   10:42 AM 07/16/2020    9:00 AM 03/07/2020   11:19 AM 07/10/2019    8:49 AM 11/24/2018    8:26 AM  Advanced Directives  Does Patient Have a Medical Advance Directive? Yes Yes Yes Yes Yes Yes No  Type of Corporate treasurer of [place];Living will [place];Living will [place];Living will [place];Living will [place];Living will   Does patient want to make changes to medical advance directive?   No - Patient declined  No - Patient declined    Copy of [place] in Chart?  No - copy requested    No - copy requested     Current Medications (verified) Outpatient Encounter Medications as of 09/02/2022  Medication Sig   Ascorbic Acid (VITAMIN C PO) Take 500 mg by mouth daily.    B Complex Vitamins (B COMPLEX 100 PO) Take 100 mg by mouth daily.   Blood Pressure Monitoring (OMRON 5 SERIES BP MONITOR) DEVI    Cholecalciferol (VITAMIN D-3 PO) Take 5,000 Units by mouth daily.    clonazePAM (KLONOPIN) 0.5 MG tablet Take 1 tablet (0.5 mg total) by mouth daily.   Coenzyme Q10 (CO Q 10 PO) Take 100 mg by mouth daily.    magnesium oxide (MAG-OX) 400 (240 Mg) MG tablet Take 400 mg by mouth daily.   rosuvastatin (CRESTOR) 40 MG tablet TAKE 1 TABLET(40 MG) BY MOUTH DAILY   No facility-administered encounter medications on file as of 09/02/2022.    Allergies (verified) Iodine and Sulfa antibiotics   History: Past Medical History:  Diagnosis Date   Anginal pain ([place]) 05/14/2008   atypical chest pain-- Exercise  Myoview  EF 61% ,mod to severe ischemia Basal  Anterior ,Anteroseptal,Mid Anterior,Mid Anteroseptal, Apical Anterior and Apical Septal regions   LV normal    Coronary artery disease 06/23/2010   Echo EF =>55%,LV normal   Hyperlipidemia    Hypertension    Sleep apnea    Tremor, essential 02/19/2016   Past Surgical History:  Procedure Laterality Date   CARDIAC CATHETERIZATION  05/15/2008   2 areas of LAD   CORONARY ANGIOPLASTY WITH STENT PLACEMENT  05/15/2008    proximal LAD -MiniVision stent 2.5 x 12 and mid LAD 2 overlapping MiniVision 2.0 x 15   Family History  Problem Relation Age of Onset   Diabetes Mother    Hypertension Mother    Kidney failure Mother    Transient ischemic attack Father    Coronary artery disease Father    Dementia Father    Cancer Brother    Social History   Socioeconomic History   Marital status: Married    Spouse name: Carolyn Martin   Number of children: 2   Years of education: 12   Highest education level: Not on file  Occupational History   Occupation: Retired    Fish farm manager: [employer]  Tobacco Use   Smoking status: Former    Packs/day: 1.00    Types: Cigarettes    Start date: 04/06/1970  Quit date: 12/16/1996    Years since quitting: 25.7   Smokeless tobacco: Never   Tobacco comments:    1ppd x 7 years,   Vaping Use   Vaping Use: Never used  Substance and Sexual Activity   Alcohol use: No    Alcohol/week: 0.0 standard drinks of alcohol   Drug use: No   Sexual activity: Yes  Other Topics Concern   Not on file  Social History Narrative   Lives w/ husband   Right-handed   Drinks about 12 oz caffeinated beverage per day   Social Determinants of Health   Financial Resource Strain: Low Risk  (07/22/2021)   Overall Financial Resource Strain (CARDIA)    Difficulty of Paying Living Expenses: Not hard at all  Food Insecurity: No Food Insecurity (07/22/2021)   Hunger Vital Sign    Worried About Running Out of Food in the Last Year: Never true    Ran Out of Food in the Last Year: Never true   Transportation Needs: No Transportation Needs (07/22/2021)   PRAPARE - Hydrologist (Medical): No    Lack of Transportation (Non-Medical): No  Physical Activity: Sufficiently Active (07/22/2021)   Exercise Vital Sign    Days of Exercise per Week: 5 days    Minutes of Exercise per Session: 30 min  Stress: No Stress Concern Present (07/22/2021)   [place]    Feeling of Stress : Not at all  Social Connections: Not on file    Tobacco Counseling Counseling given: Not Answered Tobacco comments: 1ppd x 7 years,    Clinical Intake:                 Diabetic?No         Activities of Daily Living     No data to display           Patient Care Team: Glendale Chard, MD as PCP - General (Internal Medicine)  Indicate any recent Medical Services you may have received from other than [facility] providers in the past year (date may be approximate).     Assessment:   This is a routine wellness examination for Carolyn Martin.  Hearing/Vision screen No results found.  Dietary issues and exercise activities discussed:     Goals Addressed   None   Depression Screen    07/22/2021    9:03 AM 07/16/2020    9:01 AM 07/16/2020    8:51 AM 07/10/2019    8:49 AM 01/16/2019    8:20 AM 11/28/2018   10:26 AM 07/18/2018    9:08 AM  PHQ 2/9 Scores  PHQ - 2 Score 0 0 0 0 0 0 0  PHQ- 9 Score    0       Fall Risk    07/22/2021    9:03 AM 07/16/2020    9:00 AM 07/16/2020    8:53 AM 07/10/2019    8:49 AM 01/16/2019    8:20 AM  Fall Risk   Falls in the past year? 0 0 0 0 0  Number falls in past yr:    0   Risk for fall due to : Medication side effect Medication side effect  Medication side effect   Follow up Falls evaluation completed;Education provided;Falls prevention discussed Falls evaluation completed;Education provided;Falls prevention discussed  Falls evaluation completed;Education  provided;Falls prevention discussed     FALL RISK PREVENTION PERTAINING TO THE HOME:  Any stairs in or around  the home? Yes  If so, are there any without handrails? Yes  Home free of loose throw rugs in walkways, pet beds, electrical cords, etc? Yes  Adequate lighting in your home to reduce risk of falls? Yes   ASSISTIVE DEVICES UTILIZED TO PREVENT FALLS:  Life alert? No  Use of a cane, walker or w/c? No  Grab bars in the bathroom? No  Shower chair or bench in shower? No  Elevated toilet seat or a handicapped toilet? No   TIMED UP AND GO:  Was the test performed? No .  Length of time to ambulate 10 feet: n/a sec.   Gait steady and fast without use of assistive device  Cognitive Function:        07/22/2021    9:04 AM 07/16/2020    9:02 AM 07/10/2019    8:52 AM  6CIT Screen  What Year? 0 points 0 points 0 points  What month? 0 points 0 points 0 points  What time? 0 points 0 points 0 points  Count back from 20 0 points 0 points 0 points  Months in reverse 2 points 0 points 0 points  Repeat phrase 0 points 0 points 0 points  Total Score 2 points 0 points 0 points    Immunizations Immunization History  Administered Date(s) Administered   Pneumococcal Polysaccharide-23 07/19/2017   Tdap 02/24/2018    TDAP status: Up to date  Flu Vaccine status: Declined, Education has been provided regarding the importance of this vaccine but patient still declined. Advised may receive this vaccine at local pharmacy or Health Dept. Aware to provide a copy of the vaccination record if obtained from local pharmacy or Health Dept. Verbalized acceptance and understanding.  Pneumococcal vaccine status: Declined,  Education has been provided regarding the importance of this vaccine but patient still declined. Advised may receive this vaccine at local pharmacy or Health Dept. Aware to provide a copy of the vaccination record if obtained from local pharmacy or Health Dept. Verbalized acceptance  and understanding.   Covid-19 vaccine status: Declined, Education has been provided regarding the importance of this vaccine but patient still declined. Advised may receive this vaccine at local pharmacy or Health Dept.or vaccine clinic. Aware to provide a copy of the vaccination record if obtained from local pharmacy or Health Dept. Verbalized acceptance and understanding.  Qualifies for Shingles Vaccine? Yes   Zostavax completed No   Shingrix Completed?: No.    Education has been provided regarding the importance of this vaccine. Patient has been advised to call insurance company to determine out of pocket expense if they have not yet received this vaccine. Advised may also receive vaccine at local pharmacy or Health Dept. Verbalized acceptance and understanding.  Screening Tests Health Maintenance  Topic Date Due   COVID-19 Vaccine (1) Never done   Zoster Vaccines- Shingrix (1 of 2) Never done   Pneumonia Vaccine 55+ Years old (2 - PCV) 07/19/2018   INFLUENZA VACCINE  Never done   Medicare Annual Wellness (AWV)  09/03/2023   MAMMOGRAM  09/25/2023   TETANUS/TDAP  02/25/2028   COLONOSCOPY (Pts 45-42yr Insurance coverage will need to be confirmed)  05/30/2028   DEXA SCAN  Completed   Hepatitis C Screening  Completed   HPV VACCINES  Aged Out    Health Maintenance  Health Maintenance Due  Topic Date Due   COVID-19 Vaccine (1) Never done   Zoster Vaccines- Shingrix (1 of 2) Never done   Pneumonia Vaccine 70 Years old (2 -  PCV) 07/19/2018   INFLUENZA VACCINE  Never done    Colorectal cancer screening: Type of screening: Colonoscopy. Completed 05/30/2018. Repeat every 10 years  Mammogram status: Completed 2. Repeat every year  Bone Density status: Completed 06/02/2017. Results reflect: Bone density results: NORMAL. Repeat every 0 years.  Lung Cancer Screening: (Low Dose CT Chest recommended if Age 60-80 years, 30 pack-year currently smoking OR have quit w/in 15years.) does qualify.    Lung Cancer Screening Referral: n/a  Additional Screening:  Hepatitis C Screening: does qualify; Completed 07/27/2020  Vision Screening: Recommended annual ophthalmology exams for early detection of glaucoma and other disorders of the eye. Is the patient up to date with their annual eye exam?  Yes  Who is the provider or what is the name of the office in which the patient attends annual eye exams? [place] [facility]. If pt is not established with a provider, would they like to be referred to a provider to establish care?  N/a .   Dental Screening: Recommended annual dental exams for proper oral hygiene  Community Resource Referral / Chronic Care Management: CRR required this visit?  No   CCM required this visit?  No      Plan:     I have personally reviewed and noted the following in the patient's chart:   Medical and social history Use of alcohol, tobacco or illicit drugs  Current medications and supplements including opioid prescriptions. Patient is currently taking opioid prescriptions. Information provided to patient regarding non-opioid alternatives. Patient advised to discuss non-opioid treatment plan with their provider. Functional ability and status Nutritional status Physical activity Advanced directives List of other physicians Hospitalizations, surgeries, and ER visits in previous 12 months Vitals Screenings to include cognitive, depression, and falls Referrals and appointments  In addition, I have reviewed and discussed with patient certain preventive protocols, quality metrics, and best practice recommendations. A written personalized care plan for preventive services as well as general preventive health recommendations were provided to patient.     Tonie Griffith, [place]   09/02/2022   Nurse Notes: Patient was in great mood despite not feeling the best.

## 2022-11-16 ENCOUNTER — Inpatient Hospital Stay (HOSPITAL_BASED_OUTPATIENT_CLINIC_OR_DEPARTMENT_OTHER): Payer: Medicare Other | Admitting: Oncology

## 2022-11-16 ENCOUNTER — Encounter: Payer: Self-pay | Admitting: Oncology

## 2022-11-16 ENCOUNTER — Inpatient Hospital Stay: Payer: Medicare Other | Attending: Oncology

## 2022-11-16 VITALS — BP 125/99 | HR 73 | Temp 97.6°F | Resp 16 | Wt 121.2 lb

## 2022-11-16 DIAGNOSIS — I10 Essential (primary) hypertension: Secondary | ICD-10-CM | POA: Insufficient documentation

## 2022-11-16 DIAGNOSIS — Z79899 Other long term (current) drug therapy: Secondary | ICD-10-CM | POA: Insufficient documentation

## 2022-11-16 DIAGNOSIS — C9110 Chronic lymphocytic leukemia of B-cell type not having achieved remission: Secondary | ICD-10-CM | POA: Diagnosis not present

## 2022-11-16 DIAGNOSIS — Z87891 Personal history of nicotine dependence: Secondary | ICD-10-CM | POA: Insufficient documentation

## 2022-11-16 LAB — CBC WITH DIFFERENTIAL/PLATELET
Abs Immature Granulocytes: 0.02 10*3/uL (ref 0.00–0.07)
Basophils Absolute: 0 10*3/uL (ref 0.0–0.1)
Basophils Relative: 0 %
Eosinophils Absolute: 0.1 10*3/uL (ref 0.0–0.5)
Eosinophils Relative: 1 %
HCT: 40.6 % (ref 36.0–46.0)
Hemoglobin: 13.4 g/dL (ref 12.0–15.0)
Immature Granulocytes: 0 %
Lymphocytes Relative: 70 %
Lymphs Abs: 8.8 10*3/uL — ABNORMAL HIGH (ref 0.7–4.0)
MCH: 31.6 pg (ref 26.0–34.0)
MCHC: 33 g/dL (ref 30.0–36.0)
MCV: 95.8 fL (ref 80.0–100.0)
Monocytes Absolute: 0.5 10*3/uL (ref 0.1–1.0)
Monocytes Relative: 4 %
Neutro Abs: 3.2 10*3/uL (ref 1.7–7.7)
Neutrophils Relative %: 25 %
Platelets: 212 10*3/uL (ref 150–400)
RBC: 4.24 MIL/uL (ref 3.87–5.11)
RDW: 12.5 % (ref 11.5–15.5)
Smear Review: NORMAL
WBC: 12.6 10*3/uL — ABNORMAL HIGH (ref 4.0–10.5)
nRBC: 0 % (ref 0.0–0.2)

## 2022-11-16 LAB — COMPREHENSIVE METABOLIC PANEL
ALT: 18 U/L (ref 0–44)
AST: 22 U/L (ref 15–41)
Albumin: 4.2 g/dL (ref 3.5–5.0)
Alkaline Phosphatase: 71 U/L (ref 38–126)
Anion gap: 8 (ref 5–15)
BUN: 23 mg/dL (ref 8–23)
CO2: 27 mmol/L (ref 22–32)
Calcium: 9.2 mg/dL (ref 8.9–10.3)
Chloride: 103 mmol/L (ref 98–111)
Creatinine, Ser: 0.9 mg/dL (ref 0.44–1.00)
GFR, Estimated: >60 mL/min (ref >60)
Glucose, Bld: 90 mg/dL (ref 70–99)
Potassium: 4.2 mmol/L (ref 3.5–5.1)
Sodium: 138 mmol/L (ref 135–145)
Total Bilirubin: 0.6 mg/dL (ref 0.3–1.2)
Total Protein: 7.1 g/dL (ref 6.5–8.1)

## 2022-11-16 LAB — LACTATE DEHYDROGENASE: LDH: 105 U/L (ref 98–192)

## 2022-11-16 NOTE — Assessment & Plan Note (Addendum)
06/08/22 peripheral blood flowcytometry  Chronic lymphocytic leukemia (CLL), positive for CD20, CD22 and CD19, negative for CD38 New diagnosis was discussed with patient. No constitutional symptoms. No anemia, thrombocytopenia, RAI Stage 0 Recommend watchful waiting.  Check CLL FISH panel and IGVH hypermutation- pending.

## 2022-11-16 NOTE — Progress Notes (Signed)
Hematology/Oncology Progress note [phone] [phone]     CHIEF COMPLAINTS/REASON FOR VISIT:  Follow up of leukocytosis  ASSESSMENT & PLAN:   Cancer Staging  CLL (chronic lymphocytic leukemia) ([place]) Staging form: Chronic Lymphocytic Leukemia / Small Lymphocytic Lymphoma, AJCC 8th Edition - Clinical stage from 07/14/2022: Modified Rai Stage 0 (Modified Rai risk: Low, Lymphocytosis: Present, Adenopathy: Absent, Organomegaly: Absent, Anemia: Absent, Thrombocytopenia: Absent) - Signed by Earlie Server, MD on 07/14/2022   CLL (chronic lymphocytic leukemia) ([place]) 06/08/22 peripheral blood flowcytometry  Chronic lymphocytic leukemia (CLL), positive for CD20, CD22 and CD19, negative for CD38 New diagnosis was discussed with patient. No constitutional symptoms. No anemia, thrombocytopenia, RAI Stage 0 Recommend watchful waiting.  Check CLL FISH panel and IGVH hypermutation- pending.    Orders Placed This Encounter  Procedures   CBC with Differential/Platelet    Standing Status:   Future    Standing Expiration Date:   11/16/2023   Comprehensive metabolic panel    Standing Status:   Future    Standing Expiration Date:   11/16/2023   Lactate dehydrogenase    Standing Status:   Future    Standing Expiration Date:   11/17/2023   Follow up in 6 months. All questions were answered. The patient knows to call the clinic with any problems, questions or concerns.  Earlie Server, MD, PhD [facility] Hematology Oncology 11/16/2022     HISTORY OF PRESENTING ILLNESS:  Carolyn Martin is a  72 y.o.  female with PMH listed below who was referred to me for evaluation of leukocytosis Reviewed patient' recent labs obtained by PCP.   07/16/2020 CBC showed elevated white count of 9.6, lymphocyte 6.7. Previous lab records reviewed. Leukocytosis onset of chronic, duration is since at least September 2020.  No aggravating or elevated factors. Associated symptoms or signs:  Denies weight  loss, fever, chills,night sweats.  Denies fatigue Smoking history: She is a former smoker.  She quit smoking 2008. History of recent oral steroid use or steroid injection: Denies History of recent infection: Patient had recent COVID-19 infection in September 2021.  Patient got Mab infusion. Autoimmune disease history.  Denies   She reports that her mom has a history of CLL + CAD, s/p stenting.    INTERVAL HISTORY Carolyn Martin is a 72 y.o. female who has above history reviewed by me today presents for follow up visit for management of Lymphocytosis Weight is stable. She denies night sweats, fever No new complaints.    Review of Systems  Constitutional:  Negative for appetite change, chills, fatigue and fever.  HENT:   Negative for hearing loss and voice change.   Eyes:  Negative for eye problems.  Respiratory:  Negative for chest tightness and cough.   Cardiovascular:  Negative for chest pain.  Gastrointestinal:  Negative for abdominal distention, abdominal pain and blood in stool.  Endocrine: Negative for hot flashes.  Genitourinary:  Negative for difficulty urinating and frequency.   Musculoskeletal:  Negative for arthralgias.  Skin:  Negative for itching and rash.  Neurological:  Negative for extremity weakness.  Hematological:  Negative for adenopathy.  Psychiatric/Behavioral:  Negative for confusion.      MEDICAL HISTORY:  Past Medical History:  Diagnosis Date   Anginal pain ([place]) 05/14/2008   atypical chest pain-- Exercise  Myoview  EF 61% ,mod to severe ischemia Basal Anterior ,Anteroseptal,Mid Anterior,Mid Anteroseptal, Apical Anterior and Apical Septal regions   LV normal    Coronary artery disease 06/23/2010   Echo  EF =>55%,LV normal   Hyperlipidemia    Hypertension    Sleep apnea    Tremor, essential 02/19/2016    SURGICAL HISTORY: Past Surgical History:  Procedure Laterality Date   CARDIAC CATHETERIZATION  05/15/2008   2 areas of LAD   CORONARY  ANGIOPLASTY WITH STENT PLACEMENT  05/15/2008    proximal LAD -MiniVision stent 2.5 x 12 and mid LAD 2 overlapping MiniVision 2.0 x 15    SOCIAL HISTORY: Social History   Socioeconomic History   Marital status: Married    Spouse name: Hendricks Milo   Number of children: 2   Years of education: 12   Highest education level: Not on file  Occupational History   Occupation: Retired    Fish farm manager: [employer]  Tobacco Use   Smoking status: Former    Packs/day: 1.00    Types: Cigarettes    Start date: 04/06/1970    Quit date: 12/16/1996    Years since quitting: 25.9   Smokeless tobacco: Never   Tobacco comments:    1ppd x 7 years,   Vaping Use   Vaping Use: Never used  Substance and Sexual Activity   Alcohol use: No   Drug use: No   Sexual activity: Yes  Other Topics Concern   Not on file  Social History Narrative   Lives w/ husband   Right-handed   Drinks about 12 oz caffeinated beverage per day   Social Determinants of Health   Financial Resource Strain: Low Risk  (09/02/2022)   Overall Financial Resource Strain (CARDIA)    Difficulty of Paying Living Expenses: Not hard at all  Food Insecurity: No Food Insecurity (09/02/2022)   Hunger Vital Sign    Worried About Running Out of Food in the Last Year: Never true    [place] in the Last Year: Never true  Transportation Needs: No Transportation Needs (09/02/2022)   PRAPARE - Hydrologist (Medical): No    Lack of Transportation (Non-Medical): No  Physical Activity: Sufficiently Active (09/02/2022)   Exercise Vital Sign    Days of Exercise per Week: 5 days    Minutes of Exercise per Session: 30 min  Stress: No Stress Concern Present (09/02/2022)   [place]    Feeling of Stress : Only a little  Social Connections: Moderately Integrated (09/02/2022)   Social Connection and Isolation Panel [NHANES]    Frequency of Communication  with Friends and Family: More than three times a week    Frequency of Social Gatherings with Friends and Family: More than three times a week    Attends Religious Services: More than 4 times per year    Active Member of [employer] or Organizations: No    Attends Archivist Meetings: Never    Marital Status: Married  Human resources officer Violence: Not At Risk (09/02/2022)   Humiliation, Afraid, Rape, and Kick questionnaire    Fear of Current or Ex-Partner: No    Emotionally Abused: No    Physically Abused: No    Sexually Abused: No    FAMILY HISTORY: Family History  Problem Relation Age of Onset   Diabetes Mother    Hypertension Mother    Kidney failure Mother    Transient ischemic attack Father    Coronary artery disease Father    Dementia Father    Cancer Brother     ALLERGIES:  is allergic to iodine and sulfa antibiotics.  MEDICATIONS:  Current Outpatient Medications  Medication Sig Dispense Refill   Ascorbic Acid (VITAMIN C PO) Take 500 mg by mouth daily.      B Complex Vitamins (B COMPLEX 100 PO) Take 100 mg by mouth daily.     Blood Pressure Monitoring (OMRON 5 SERIES BP MONITOR) DEVI      Cholecalciferol (VITAMIN D-3 PO) Take 5,000 Units by mouth daily.      clonazePAM (KLONOPIN) 0.5 MG tablet Take 1 tablet (0.5 mg total) by mouth daily. 30 tablet 0   Coenzyme Q10 (CO Q 10 PO) Take 100 mg by mouth daily.      magnesium oxide (MAG-OX) 400 (240 Mg) MG tablet Take 400 mg by mouth daily.     rosuvastatin (CRESTOR) 40 MG tablet TAKE 1 TABLET(40 MG) BY MOUTH DAILY (Patient not taking: Reported on 11/16/2022) 90 tablet 3   No current facility-administered medications for this visit.     PHYSICAL EXAMINATION: ECOG PERFORMANCE STATUS: 0 - Asymptomatic Vitals:   11/16/22 0941  BP: (!) 125/99  Pulse: 73  Resp: 16  Temp: 97.6 F (36.4 C)  SpO2: 99%   Filed Weights   11/16/22 0941  Weight: 121 lb 3.2 oz (55 kg)    Physical Exam Constitutional:      General: She  is not in acute distress. HENT:     Head: Normocephalic and atraumatic.  Eyes:     General: No scleral icterus. Cardiovascular:     Rate and Rhythm: Normal rate.  Pulmonary:     Effort: Pulmonary effort is normal. No respiratory distress.     Breath sounds: No wheezing.  Abdominal:     General: Bowel sounds are normal. There is no distension.     Palpations: Abdomen is soft.     Tenderness: There is no abdominal tenderness.  Musculoskeletal:        General: No deformity. Normal range of motion.     Cervical back: Normal range of motion and neck supple.  Skin:    General: Skin is warm and dry.  Neurological:     Mental Status: She is alert and oriented to person, place, and time. Mental status is at baseline.     Cranial Nerves: No cranial nerve deficit.  Psychiatric:        Mood and Affect: Mood normal.     Labs     Latest Ref Rng & Units 11/16/2022    9:25 AM 06/08/2022    8:57 AM 07/22/2021    9:56 AM  CBC  WBC 4.0 - 10.5 K/uL 12.6  13.2  16.1   Hemoglobin 12.0 - 15.0 g/dL 13.4  13.5  14.1   Hematocrit 36.0 - 46.0 % 40.6  41.0  43.2   Platelets 150 - 400 K/uL 212  210  233       Latest Ref Rng & Units 11/16/2022    9:25 AM 06/08/2022    8:57 AM 07/22/2021    9:56 AM  CMP  Glucose 70 - 99 mg/dL 90  90  86   BUN 8 - 23 mg/dL '23  23  21   '$ Creatinine 0.44 - 1.00 mg/dL 0.90  0.99  0.88   Sodium 135 - 145 mmol/L 138  138  143   Potassium 3.5 - 5.1 mmol/L 4.2  3.9  5.0   Chloride 98 - 111 mmol/L 103  103  105   CO2 22 - 32 mmol/L '27  27  25   '$ Calcium 8.9 - 10.3 mg/dL  9.2  9.3  9.9   Total Protein 6.5 - 8.1 g/dL 7.1  7.0  6.9   Total Bilirubin 0.3 - 1.2 mg/dL 0.6  0.8  0.5   Alkaline Phos 38 - 126 U/L 71  58  94   AST 15 - 41 U/L '22  20  29   '$ ALT 0 - 44 U/L 18  18  34     RADIOGRAPHIC STUDIES: I have personally reviewed the radiological images as listed and agreed with the findings in the report. No results found.  LABORATORY DATA:  I have reviewed the data as  listed    Latest Ref Rng & Units 11/16/2022    9:25 AM 06/08/2022    8:57 AM 07/22/2021    9:56 AM  CBC  WBC 4.0 - 10.5 K/uL 12.6  13.2  16.1   Hemoglobin 12.0 - 15.0 g/dL 13.4  13.5  14.1   Hematocrit 36.0 - 46.0 % 40.6  41.0  43.2   Platelets 150 - 400 K/uL 212  210  233       Latest Ref Rng & Units 11/16/2022    9:25 AM 06/08/2022    8:57 AM 07/22/2021    9:56 AM  CMP  Glucose 70 - 99 mg/dL 90  90  86   BUN 8 - 23 mg/dL '23  23  21   '$ Creatinine 0.44 - 1.00 mg/dL 0.90  0.99  0.88   Sodium 135 - 145 mmol/L 138  138  143   Potassium 3.5 - 5.1 mmol/L 4.2  3.9  5.0   Chloride 98 - 111 mmol/L 103  103  105   CO2 22 - 32 mmol/L '27  27  25   '$ Calcium 8.9 - 10.3 mg/dL 9.2  9.3  9.9   Total Protein 6.5 - 8.1 g/dL 7.1  7.0  6.9   Total Bilirubin 0.3 - 1.2 mg/dL 0.6  0.8  0.5   Alkaline Phos 38 - 126 U/L 71  58  94   AST 15 - 41 U/L '22  20  29   '$ ALT 0 - 44 U/L 18  18  34

## 2022-11-21 LAB — FISH HES LEUKEMIA, 4Q12 REA

## 2022-11-25 LAB — MISC LABCORP TEST (SEND OUT): Labcorp test code: 113753

## 2022-12-14 ENCOUNTER — Encounter: Payer: Self-pay | Admitting: Oncology

## 2022-12-22 ENCOUNTER — Other Ambulatory Visit: Payer: Self-pay | Admitting: Internal Medicine

## 2022-12-22 MED ORDER — CLONAZEPAM 0.5 MG PO TABS: 0.5000 mg | ORAL_TABLET | Freq: Every day | ORAL | 0 refills | Status: DC

## 2022-12-27 DIAGNOSIS — J029 Acute pharyngitis, unspecified: Secondary | ICD-10-CM | POA: Diagnosis not present

## 2022-12-27 DIAGNOSIS — R110 Nausea: Secondary | ICD-10-CM | POA: Diagnosis not present

## 2022-12-27 DIAGNOSIS — J069 Acute upper respiratory infection, unspecified: Secondary | ICD-10-CM | POA: Diagnosis not present

## 2023-01-11 ENCOUNTER — Encounter: Payer: Self-pay | Admitting: Internal Medicine

## 2023-01-11 ENCOUNTER — Ambulatory Visit (INDEPENDENT_AMBULATORY_CARE_PROVIDER_SITE_OTHER): Payer: Medicare Other | Admitting: Internal Medicine

## 2023-01-11 VITALS — BP 134/80 | HR 75 | Temp 98.0°F | Ht 62.0 in | Wt 121.2 lb

## 2023-01-11 DIAGNOSIS — E7800 Pure hypercholesterolemia, unspecified: Secondary | ICD-10-CM | POA: Diagnosis not present

## 2023-01-11 DIAGNOSIS — G250 Essential tremor: Secondary | ICD-10-CM | POA: Diagnosis not present

## 2023-01-11 DIAGNOSIS — Z0000 Encounter for general adult medical examination without abnormal findings: Secondary | ICD-10-CM

## 2023-01-11 DIAGNOSIS — E2839 Other primary ovarian failure: Secondary | ICD-10-CM | POA: Diagnosis not present

## 2023-01-11 DIAGNOSIS — I2583 Coronary atherosclerosis due to lipid rich plaque: Secondary | ICD-10-CM | POA: Diagnosis not present

## 2023-01-11 DIAGNOSIS — Z23 Encounter for immunization: Secondary | ICD-10-CM

## 2023-01-11 DIAGNOSIS — Z0001 Encounter for general adult medical examination with abnormal findings: Secondary | ICD-10-CM | POA: Diagnosis not present

## 2023-01-11 DIAGNOSIS — I2510 Atherosclerotic heart disease of native coronary artery without angina pectoris: Secondary | ICD-10-CM

## 2023-01-11 DIAGNOSIS — I119 Hypertensive heart disease without heart failure: Secondary | ICD-10-CM | POA: Diagnosis not present

## 2023-01-11 DIAGNOSIS — I10 Essential (primary) hypertension: Secondary | ICD-10-CM | POA: Diagnosis not present

## 2023-01-11 DIAGNOSIS — E559 Vitamin D deficiency, unspecified: Secondary | ICD-10-CM | POA: Diagnosis not present

## 2023-01-11 LAB — POCT URINALYSIS DIPSTICK
Bilirubin, UA: NEGATIVE
Blood, UA: NEGATIVE
Glucose, UA: NEGATIVE
Ketones, UA: NEGATIVE
Leukocytes, UA: NEGATIVE
Nitrite, UA: NEGATIVE
Protein, UA: NEGATIVE
Spec Grav, UA: 1.02 (ref 1.010–1.025)
Urobilinogen, UA: 0.2 E.U./dL
pH, UA: 7 (ref 5.0–8.0)

## 2023-01-11 MED ORDER — CLONAZEPAM 0.5 MG PO TABS: 0.5000 mg | ORAL_TABLET | Freq: Every day | ORAL | 1 refills | Status: DC | PRN

## 2023-01-11 NOTE — Patient Instructions (Signed)

## 2023-01-11 NOTE — Progress Notes (Signed)
[name],acting as a Neurosurgeon for Carolyn Aliment, MD.,have documented all relevant documentation on the behalf of Carolyn Aliment, MD,as directed by  Carolyn Aliment, MD while in the presence of Carolyn Aliment, MD.   Subjective:     Patient ID: Carolyn Martin , female    DOB: Jan 30, 1951 , 72 y.o.   MRN: [id number]   Chief Complaint  Patient presents with   Annual Exam   Hyperlipidemia   Hypertension    HPI  She reports today for annual exam. She denies headaches, chest pain, and SOB.  She states she has since stopped all of her meds. She no longer feels she needs the medication. She has stopped cholesterol meds, she stopped BP meds many years ago.   BP Readings from Last 3 Encounters: 01/11/23 : 134/80 11/16/22 : (!) 125/99 09/02/22 : 107/68     Hypertension This is a chronic problem. The current episode started more than 1 year ago. The problem has been gradually improving since onset. The problem is controlled. Pertinent negatives include no blurred vision, chest pain, palpitations or shortness of breath. Risk factors for coronary artery disease include post-menopausal state and dyslipidemia. Past treatments include lifestyle changes. The current treatment provides moderate improvement. There are no compliance problems.  Hypertensive end-organ damage includes CAD/MI.  Hyperlipidemia This is a chronic problem. The problem is controlled. Pertinent negatives include no chest pain or shortness of breath. Risk factors for coronary artery disease include dyslipidemia and post-menopausal.     Past Medical History:  Diagnosis Date   Anginal pain (HCC) 05/14/2008   atypical chest pain-- Exercise  Myoview  EF 61% ,mod to severe ischemia Basal Anterior ,Anteroseptal,Mid Anterior,Mid Anteroseptal, Apical Anterior and Apical Septal regions   LV normal    Coronary artery disease 06/23/2010   Echo EF =>55%,LV normal   Hyperlipidemia    Hypertension    Sleep apnea    Tremor,  essential 02/19/2016     Family History  Problem Relation Age of Onset   Diabetes Mother    Hypertension Mother    Kidney failure Mother    Transient ischemic attack Father    Coronary artery disease Father    Dementia Father    Cancer Brother      Current Outpatient Medications:    Ascorbic Acid (VITAMIN C PO), Take 500 mg by mouth daily. , Disp: , Rfl:    B Complex Vitamins (B COMPLEX 100 PO), Take 100 mg by mouth daily., Disp: , Rfl:    Blood Pressure Monitoring (OMRON 5 SERIES BP MONITOR) DEVI, , Disp: , Rfl:    Cholecalciferol (VITAMIN D-3 PO), Take 5,000 Units by mouth daily. , Disp: , Rfl:    Coenzyme Q10 (CO Q 10 PO), Take 100 mg by mouth daily. , Disp: , Rfl:    magnesium oxide (MAG-OX) 400 (240 Mg) MG tablet, Take 400 mg by mouth daily., Disp: , Rfl:    clonazePAM (KLONOPIN) 0.5 MG tablet, Take 1 tablet (0.5 mg total) by mouth daily as needed for anxiety., Disp: 30 tablet, Rfl: 1   rosuvastatin (CRESTOR) 40 MG tablet, TAKE 1 TABLET(40 MG) BY MOUTH DAILY (Patient not taking: Reported on 11/16/2022), Disp: 90 tablet, Rfl: 3   Allergies  Allergen Reactions   Iodine Hives   Sulfa Antibiotics       The patient states she uses post menopausal status for birth control. Last LMP was No LMP recorded. Patient is postmenopausal.. Negative for Dysmenorrhea. Negative for:  breast discharge, breast lump(s), breast pain and breast self exam. Associated symptoms include abnormal vaginal bleeding. Pertinent negatives include abnormal bleeding (hematology), anxiety, decreased libido, depression, difficulty falling sleep, dyspareunia, history of infertility, nocturia, sexual dysfunction, sleep disturbances, urinary incontinence, urinary urgency, vaginal discharge and vaginal itching. Diet regular.The patient states her exercise level is    . The patient's tobacco use is:  Social History   Tobacco Use  Smoking Status Former   Packs/day: 1   Types: Cigarettes   Start date: 04/06/1970   Quit  date: 12/16/1996   Years since quitting: 26.1  Smokeless Tobacco Never  Tobacco Comments   1ppd x 7 years,   . She has been exposed to passive smoke. The patient's alcohol use is:  Social History   Substance and Sexual Activity  Alcohol Use No    Review of Systems  Constitutional: Negative.   HENT: Negative.    Eyes: Negative.  Negative for blurred vision.  Respiratory: Negative.  Negative for shortness of breath.   Cardiovascular: Negative.  Negative for chest pain and palpitations.  Gastrointestinal: Negative.   Endocrine: Negative.   Genitourinary: Negative.   Musculoskeletal: Negative.   Skin: Negative.   Allergic/Immunologic: Negative.   Neurological: Negative.   Hematological: Negative.   Psychiatric/Behavioral: Negative.       Today's Vitals   01/11/23 0939  BP: 134/80  Pulse: 75  Temp: 98 F (36.7 C)  TempSrc: Oral  Weight: 121 lb 3.2 oz (55 kg)  Height: 5\' 2"  (1.575 m)  PainSc: 0-No pain   Body mass index is 22.17 kg/m.  Wt Readings from Last 3 Encounters:  01/11/23 121 lb 3.2 oz (55 kg)  11/16/22 121 lb 3.2 oz (55 kg)  09/02/22 121 lb (54.9 kg)    Objective:  Physical Exam Vitals and nursing note reviewed.  Constitutional:      Appearance: Normal appearance.  HENT:     Head: Normocephalic and atraumatic.     Right Ear: Tympanic membrane, ear canal and external ear normal.     Left Ear: Tympanic membrane, ear canal and external ear normal.     Nose:     Comments: Masked     Mouth/Throat:     Comments: Masked  Eyes:     Extraocular Movements: Extraocular movements intact.     Conjunctiva/sclera: Conjunctivae normal.     Pupils: Pupils are equal, round, and reactive to light.  Cardiovascular:     Rate and Rhythm: Regular rhythm.     Pulses: Normal pulses.          Dorsalis pedis pulses are 2+ on the right side and 2+ on the left side.     Heart sounds: Normal heart sounds.  Pulmonary:     Effort: Pulmonary effort is normal.     Breath  sounds: Normal breath sounds.  Chest:  Breasts:    Tanner Score is 5.     Right: Normal.     Left: Normal.  Abdominal:     General: Abdomen is flat. Bowel sounds are normal.     Palpations: Abdomen is soft.  Genitourinary:    Comments: deferred Musculoskeletal:        General: Normal range of motion.     Cervical back: Normal range of motion and neck supple.  Skin:    General: Skin is warm and dry.  Neurological:     General: No focal deficit present.     Mental Status: She is alert and oriented to person, place,  and time.  Psychiatric:        Mood and Affect: Mood normal.        Behavior: Behavior normal.      Assessment And Plan:     1. Encounter for annual health examination Comments: A full exam was performed. Importance of monthly self breast exams was discussed with the patient.  PATIENT IS ADVISED TO GET 30-45 MINUTES REGULAR EXERCISE NO LESS THAN FOUR TO FIVE DAYS PER WEEK - BOTH WEIGHTBEARING EXERCISES AND AEROBIC ARE RECOMMENDED.  PATIENT IS ADVISED TO FOLLOW A HEALTHY DIET WITH AT LEAST SIX FRUITS/VEGGIES PER DAY, DECREASE INTAKE OF RED MEAT, AND TO INCREASE FISH INTAKE TO TWO DAYS PER WEEK.  MEATS/FISH SHOULD NOT BE FRIED, BAKED OR BROILED IS PREFERABLE.  IT IS ALSO IMPORTANT TO CUT BACK ON YOUR SUGAR INTAKE. PLEASE AVOID ANYTHING WITH ADDED SUGAR, CORN SYRUP OR OTHER SWEETENERS. IF YOU MUST USE A SWEETENER, YOU CAN TRY STEVIA. IT IS ALSO IMPORTANT TO AVOID ARTIFICIALLY SWEETENERS AND DIET BEVERAGES. LASTLY, I SUGGEST WEARING SPF 50 SUNSCREEN ON EXPOSED PARTS AND ESPECIALLY WHEN IN THE DIRECT SUNLIGHT FOR AN EXTENDED PERIOD OF TIME.  PLEASE AVOID FAST FOOD RESTAURANTS AND INCREASE YOUR WATER INTAKE.  2. Hypertensive heart disease without heart failure Comments: Chronic, fair control. Goal BP<120/80.  She is no longer taking meds per her preference. EKG performed, NSR w/o acute changes.  She is encouraged to follow a low sodium diet. She is advised to decrease intake of  processed meats including bacon, sausages and deli meats. She is encouraged to follow up in six months.  - POCT Urinalysis Dipstick (81002) - Microalbumin / creatinine urine ratio - EKG 12-Lead  3. Coronary artery disease due to lipid rich plaque Comments: Chronic, LDL goal < 70. Unfortunately, she is no longer on ASA, BBlocker and statin therapy.  She does not wish to see Cardiology at this time.  4. Pure hypercholesterolemia Comments: Chronic, she has stopped statin therapy. She is encouraged to follow a heart healthy lifestyle - clean eating, regular exercise and stress management. - Lipid panel - TSH  5. Benign head tremor Comments: Chronic, she has had Neurology evaluation. PDMP reviewed, I will send refill of clonazepam to use prn. - clonazePAM (KLONOPIN) 0.5 MG tablet; Take 1 tablet (0.5 mg total) by mouth daily as needed for anxiety.  Dispense: 30 tablet; Refill: 1  6. Estrogen deficiency Comments: She agrees to bone density. She is encouraged to engage in weight-bearing exercises, and to continue with calcium/Vitamin D supplementation. - DG Bone Density; Future  7. Vitamin D deficiency disease Comments: I will check a vitamin D level and supplement as needed. - Vitamin D (25 hydroxy)  8. Immunization due - Pneumococcal conjugate vaccine 20-valent (Prevnar 20)  Patient was given opportunity to ask questions. Patient verbalized understanding of the plan and was able to repeat key elements of the plan. All questions were answered to their satisfaction.   I, Carolyn Alimentobyn N Ester Hilley, MD, have reviewed all documentation for this visit. The documentation on 01/11/23 for the exam, diagnosis, procedures, and orders are all accurate and complete.  THE PATIENT IS ENCOURAGED TO PRACTICE SOCIAL DISTANCING DUE TO THE COVID-19 PANDEMIC.

## 2023-01-12 LAB — LIPID PANEL
Chol/HDL Ratio: 3.4 ratio (ref 0.0–4.4)
Cholesterol, Total: 248 mg/dL — ABNORMAL HIGH (ref 100–199)
HDL: 73 mg/dL (ref >39)
LDL Chol Calc (NIH): 150 mg/dL — ABNORMAL HIGH (ref 0–99)
Triglycerides: 143 mg/dL (ref 0–149)
VLDL Cholesterol Cal: 25 mg/dL (ref 5–40)

## 2023-01-12 LAB — VITAMIN D 25 HYDROXY (VIT D DEFICIENCY, FRACTURES): Vit D, 25-Hydroxy: 101 ng/mL — ABNORMAL HIGH (ref 30.0–100.0)

## 2023-01-12 LAB — MICROALBUMIN / CREATININE URINE RATIO
Creatinine, Urine: 77.6 mg/dL
Microalb/Creat Ratio: 4 mg/g creat (ref 0–29)
Microalbumin, Urine: 3.3 ug/mL

## 2023-01-12 LAB — TSH: TSH: 0.899 u[IU]/mL (ref 0.450–4.500)

## 2023-01-13 ENCOUNTER — Encounter: Payer: Self-pay | Admitting: Internal Medicine

## 2023-01-22 DIAGNOSIS — E2839 Other primary ovarian failure: Secondary | ICD-10-CM | POA: Insufficient documentation

## 2023-02-21 ENCOUNTER — Ambulatory Visit: Payer: Medicare Other | Admitting: Cardiovascular Disease

## 2023-03-03 ENCOUNTER — Encounter: Payer: Self-pay | Admitting: Internal Medicine

## 2023-04-18 ENCOUNTER — Encounter: Payer: Self-pay | Admitting: Internal Medicine

## 2023-05-09 ENCOUNTER — Encounter: Payer: Self-pay | Admitting: Internal Medicine

## 2023-05-17 ENCOUNTER — Encounter: Payer: Self-pay | Admitting: Oncology

## 2023-05-17 ENCOUNTER — Inpatient Hospital Stay: Payer: Medicare Other | Attending: Oncology

## 2023-05-17 ENCOUNTER — Inpatient Hospital Stay (HOSPITAL_BASED_OUTPATIENT_CLINIC_OR_DEPARTMENT_OTHER): Payer: Medicare Other | Admitting: Oncology

## 2023-05-17 VITALS — BP 119/89 | HR 67 | Temp 96.6°F | Resp 18 | Wt 125.2 lb

## 2023-05-17 DIAGNOSIS — C9110 Chronic lymphocytic leukemia of B-cell type not having achieved remission: Secondary | ICD-10-CM | POA: Diagnosis not present

## 2023-05-17 DIAGNOSIS — Z87891 Personal history of nicotine dependence: Secondary | ICD-10-CM | POA: Insufficient documentation

## 2023-05-17 LAB — COMPREHENSIVE METABOLIC PANEL
ALT: 18 U/L (ref 0–44)
AST: 20 U/L (ref 15–41)
Albumin: 4.2 g/dL (ref 3.5–5.0)
Alkaline Phosphatase: 70 U/L (ref 38–126)
Anion gap: 7 (ref 5–15)
BUN: 19 mg/dL (ref 8–23)
CO2: 26 mmol/L (ref 22–32)
Calcium: 9.4 mg/dL (ref 8.9–10.3)
Chloride: 103 mmol/L (ref 98–111)
Creatinine, Ser: 0.94 mg/dL (ref 0.44–1.00)
GFR, Estimated: >60 mL/min (ref >60)
Glucose, Bld: 92 mg/dL (ref 70–99)
Potassium: 4 mmol/L (ref 3.5–5.1)
Sodium: 136 mmol/L (ref 135–145)
Total Bilirubin: 0.8 mg/dL (ref 0.3–1.2)
Total Protein: 7.3 g/dL (ref 6.5–8.1)

## 2023-05-17 LAB — CBC WITH DIFFERENTIAL/PLATELET
Abs Immature Granulocytes: 0.03 10*3/uL (ref 0.00–0.07)
Basophils Absolute: 0.1 10*3/uL (ref 0.0–0.1)
Basophils Relative: 1 %
Eosinophils Absolute: 0.1 10*3/uL (ref 0.0–0.5)
Eosinophils Relative: 1 %
HCT: 40.6 % (ref 36.0–46.0)
Hemoglobin: 13.5 g/dL (ref 12.0–15.0)
Immature Granulocytes: 0 %
Lymphocytes Relative: 71 %
Lymphs Abs: 10.3 10*3/uL — ABNORMAL HIGH (ref 0.7–4.0)
MCH: 31.8 pg (ref 26.0–34.0)
MCHC: 33.3 g/dL (ref 30.0–36.0)
MCV: 95.5 fL (ref 80.0–100.0)
Monocytes Absolute: 0.5 10*3/uL (ref 0.1–1.0)
Monocytes Relative: 3 %
Neutro Abs: 3.4 10*3/uL (ref 1.7–7.7)
Neutrophils Relative %: 24 %
Platelets: 207 10*3/uL (ref 150–400)
RBC: 4.25 MIL/uL (ref 3.87–5.11)
RDW: 12.6 % (ref 11.5–15.5)
Smear Review: NORMAL
WBC Morphology: ABNORMAL
WBC: 14.4 10*3/uL — ABNORMAL HIGH (ref 4.0–10.5)
nRBC: 0 % (ref 0.0–0.2)

## 2023-05-17 LAB — LACTATE DEHYDROGENASE: LDH: 104 U/L (ref 98–192)

## 2023-05-17 NOTE — Assessment & Plan Note (Signed)
06/08/22 peripheral blood flowcytometry  Chronic lymphocytic leukemia (CLL), positive for CD20, CD22 and CD19, negative for CD38 13q deletion. +IgVH Somatic Hypermutation No constitutional symptoms. No anemia, thrombocytopenia, RAI Stage 0 Recommend watchful waiting.

## 2023-05-17 NOTE — Progress Notes (Signed)
Hematology/Oncology Progress note [phone] [phone]     CHIEF COMPLAINTS/REASON FOR VISIT:  Follow up of leukocytosis  ASSESSMENT & PLAN:   Cancer Staging  CLL (chronic lymphocytic leukemia) (HCC) Staging form: Chronic Lymphocytic Leukemia / Small Lymphocytic Lymphoma, AJCC 8th Edition - Clinical stage from 07/14/2022: Modified Rai Stage 0 (Modified Rai risk: Low, Lymphocytosis: Present, Adenopathy: Absent, Organomegaly: Absent, Anemia: Absent, Thrombocytopenia: Absent) - Signed by [name], MD on 07/14/2022   CLL (chronic lymphocytic leukemia) (HCC) 06/08/22 peripheral blood flowcytometry  Chronic lymphocytic leukemia (CLL), positive for CD20, CD22 and CD19, negative for CD38 13q deletion. +IgVH Somatic Hypermutation No constitutional symptoms. No anemia, thrombocytopenia, RAI Stage 0 Recommend watchful waiting.     Orders Placed This Encounter  Procedures   CBC with Differential (Cancer Center Only)    Standing Status:   Future    Standing Expiration Date:   05/16/2024   CMP (Cancer Center only)    Standing Status:   Future    Standing Expiration Date:   05/16/2024   Lactate dehydrogenase    Standing Status:   Future    Standing Expiration Date:   05/16/2024   Follow up in 6 months. All questions were answered. The patient knows to call the clinic with any problems, questions or concerns.  [name], MD, PhD [facility] Hematology Oncology 05/17/2023     HISTORY OF PRESENTING ILLNESS:  Carolyn Martin is a  72 y.o.  female with PMH listed below who was referred to me for evaluation of leukocytosis Reviewed patient' recent labs obtained by PCP.   07/16/2020 CBC showed elevated white count of 9.6, lymphocyte 6.7. Previous lab records reviewed. Leukocytosis onset of chronic, duration is since at least September 2020.  No aggravating or elevated factors. Associated symptoms or signs:  Denies weight loss, fever, chills,night sweats.  Denies  fatigue Smoking history: She is a former smoker.  She quit smoking 2008. History of recent oral steroid use or steroid injection: Denies History of recent infection: Patient had recent COVID-19 infection in September 2021.  Patient got Mab infusion. Autoimmune disease history.  Denies   She reports that her mom has a history of CLL + CAD, s/p stenting.    INTERVAL HISTORY Carolyn Martin is a 72 y.o. female who has above history reviewed by me today presents for follow up visit for management of Lymphocytosis Weight is stable. She denies fever. One episode of sweating.  No other new complaints.    Review of Systems  Constitutional:  Negative for appetite change, chills, fatigue and fever.  HENT:   Negative for hearing loss and voice change.   Eyes:  Negative for eye problems.  Respiratory:  Negative for chest tightness and cough.   Cardiovascular:  Negative for chest pain.  Gastrointestinal:  Negative for abdominal distention, abdominal pain and blood in stool.  Endocrine: Negative for hot flashes.  Genitourinary:  Negative for difficulty urinating and frequency.   Musculoskeletal:  Negative for arthralgias.  Skin:  Negative for itching and rash.  Neurological:  Negative for extremity weakness.  Hematological:  Negative for adenopathy.  Psychiatric/Behavioral:  Negative for confusion.      MEDICAL HISTORY:  Past Medical History:  Diagnosis Date   Anginal pain (HCC) 05/14/2008   atypical chest pain-- Exercise  Myoview  EF 61% ,mod to severe ischemia Basal Anterior ,Anteroseptal,Mid Anterior,Mid Anteroseptal, Apical Anterior and Apical Septal regions   LV normal    Coronary artery disease 06/23/2010   Echo EF =>  55%,LV normal   Hyperlipidemia    Hypertension    Sleep apnea    Tremor, essential 02/19/2016    SURGICAL HISTORY: Past Surgical History:  Procedure Laterality Date   CARDIAC CATHETERIZATION  05/15/2008   2 areas of LAD   CORONARY ANGIOPLASTY WITH STENT PLACEMENT   05/15/2008    proximal LAD -MiniVision stent 2.5 x 12 and mid LAD 2 overlapping MiniVision 2.0 x 15    SOCIAL HISTORY: Social History   Socioeconomic History   Marital status: Married    Spouse name: [name]   Number of children: 2   Years of education: 12   Highest education level: Not on file  Occupational History   Occupation: Retired    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[employer]  Tobacco Use   Smoking status: Former    Current packs/day: 0.00    Average packs/day: 1 pack/day for 26.7 years (26.7 ttl pk-yrs)    Types: Cigarettes    Start date: 04/06/1970    Quit date: 12/16/1996    Years since quitting: 26.4   Smokeless tobacco: Never   Tobacco comments:    1ppd x 7 years,   Vaping Use   Vaping status: Never Used  Substance and Sexual Activity   Alcohol use: No   Drug use: No   Sexual activity: Yes  Other Topics Concern   Not on file  Social History Narrative   Lives w/ husband   Right-handed   Drinks about 12 oz caffeinated beverage per day   Social Determinants of Health   Financial Resource Strain: Low Risk  (09/02/2022)   Overall Financial Resource Strain (CARDIA)    Difficulty of Paying Living Expenses: Not hard at all  Food Insecurity: No Food Insecurity (09/02/2022)   Hunger Vital Sign    Worried About Running Out of Food in the Last Year: Never true    Ran Out of Food in the Last Year: Never true  Transportation Needs: No Transportation Needs (09/02/2022)   PRAPARE - Administrator, Civil Service (Medical): No    Lack of Transportation (Non-Medical): No  Physical Activity: Sufficiently Active (09/02/2022)   Exercise Vital Sign    Days of Exercise per Week: 5 days    Minutes of Exercise per Session: 30 min  Stress: No Stress Concern Present (09/02/2022)   [employer] of Occupational Health - Occupational Stress Questionnaire    Feeling of Stress : Only a little  Social Connections: Moderately Integrated (09/02/2022)   Social Connection and Isolation  Panel [NHANES]    Frequency of Communication with Friends and Family: More than three times a week    Frequency of Social Gatherings with Friends and Family: More than three times a week    Attends Religious Services: More than 4 times per year    Active Member of [employer] or Organizations: No    Attends Banker Meetings: Never    Marital Status: Married  Catering manager Violence: Not At Risk (09/02/2022)   Humiliation, Afraid, Rape, and Kick questionnaire    Fear of Current or Ex-Partner: No    Emotionally Abused: No    Physically Abused: No    Sexually Abused: No    FAMILY HISTORY: Family History  Problem Relation Age of Onset   Diabetes Mother    Hypertension Mother    Kidney failure Mother    Transient ischemic attack Father    Coronary artery disease Father    Dementia Father    Cancer Brother  ALLERGIES:  is allergic to iodine and sulfa antibiotics.  MEDICATIONS:  Current Outpatient Medications  Medication Sig Dispense Refill   Ascorbic Acid (VITAMIN C PO) Take 500 mg by mouth daily.      B Complex Vitamins (B COMPLEX 100 PO) Take 100 mg by mouth daily.     Blood Pressure Monitoring (OMRON 5 SERIES BP MONITOR) DEVI      Cholecalciferol (VITAMIN D-3 PO) Take 5,000 Units by mouth daily.      clonazePAM (KLONOPIN) 0.5 MG tablet Take 1 tablet (0.5 mg total) by mouth daily as needed for anxiety. 30 tablet 1   Coenzyme Q10 (CO Q 10 PO) Take 100 mg by mouth daily.      magnesium oxide (MAG-OX) 400 (240 Mg) MG tablet Take 400 mg by mouth daily.     No current facility-administered medications for this visit.     PHYSICAL EXAMINATION: ECOG PERFORMANCE STATUS: 0 - Asymptomatic Vitals:   05/17/23 0952  BP: 119/89  Pulse: 67  Resp: 18  Temp: (!) 96.6 F (35.9 C)   Filed Weights   05/17/23 0952  Weight: 125 lb 3.2 oz (56.8 kg)    Physical Exam Constitutional:      General: She is not in acute distress. HENT:     Head: Normocephalic and atraumatic.   Eyes:     General: No scleral icterus. Cardiovascular:     Rate and Rhythm: Normal rate.  Pulmonary:     Effort: Pulmonary effort is normal. No respiratory distress.     Breath sounds: No wheezing.  Abdominal:     General: Bowel sounds are normal. There is no distension.     Palpations: Abdomen is soft.     Tenderness: There is no abdominal tenderness.  Musculoskeletal:        General: No deformity. Normal range of motion.     Cervical back: Normal range of motion and neck supple.  Skin:    General: Skin is warm and dry.  Neurological:     Mental Status: She is alert and oriented to person, place, and time. Mental status is at baseline.     Cranial Nerves: No cranial nerve deficit.  Psychiatric:        Mood and Affect: Mood normal.     Labs     Latest Ref Rng & Units 05/17/2023    9:38 AM 11/16/2022    9:25 AM 06/08/2022    8:57 AM  CBC  WBC 4.0 - 10.5 K/uL 14.4  12.6  13.2   Hemoglobin 12.0 - 15.0 g/dL 31.5  17.6  16.0   Hematocrit 36.0 - 46.0 % 40.6  40.6  41.0   Platelets 150 - 400 K/uL 207  212  210       Latest Ref Rng & Units 05/17/2023    9:38 AM 11/16/2022    9:25 AM 06/08/2022    8:57 AM  CMP  Glucose 70 - 99 mg/dL 92  90  90   BUN 8 - 23 mg/dL 19  23  23    Creatinine 0.44 - 1.00 mg/dL 7.37  1.06  2.69   Sodium 135 - 145 mmol/L 136  138  138   Potassium 3.5 - 5.1 mmol/L 4.0  4.2  3.9   Chloride 98 - 111 mmol/L 103  103  103   CO2 22 - 32 mmol/L 26  27  27    Calcium 8.9 - 10.3 mg/dL 9.4  9.2  9.3   Total Protein 6.5 -  8.1 g/dL 7.3  7.1  7.0   Total Bilirubin 0.3 - 1.2 mg/dL 0.8  0.6  0.8   Alkaline Phos 38 - 126 U/L 70  71  58   AST 15 - 41 U/L 20  22  20    ALT 0 - 44 U/L 18  18  18      RADIOGRAPHIC STUDIES: I have personally reviewed the radiological images as listed and agreed with the findings in the report. No results found.  LABORATORY DATA:  I have reviewed the data as listed    Latest Ref Rng & Units 05/17/2023    9:38 AM 11/16/2022    9:25  AM 06/08/2022    8:57 AM  CBC  WBC 4.0 - 10.5 K/uL 14.4  12.6  13.2   Hemoglobin 12.0 - 15.0 g/dL 27.2  53.6  64.4   Hematocrit 36.0 - 46.0 % 40.6  40.6  41.0   Platelets 150 - 400 K/uL 207  212  210       Latest Ref Rng & Units 05/17/2023    9:38 AM 11/16/2022    9:25 AM 06/08/2022    8:57 AM  CMP  Glucose 70 - 99 mg/dL 92  90  90   BUN 8 - 23 mg/dL 19  23  23    Creatinine 0.44 - 1.00 mg/dL 0.34  7.42  5.95   Sodium 135 - 145 mmol/L 136  138  138   Potassium 3.5 - 5.1 mmol/L 4.0  4.2  3.9   Chloride 98 - 111 mmol/L 103  103  103   CO2 22 - 32 mmol/L 26  27  27    Calcium 8.9 - 10.3 mg/dL 9.4  9.2  9.3   Total Protein 6.5 - 8.1 g/dL 7.3  7.1  7.0   Total Bilirubin 0.3 - 1.2 mg/dL 0.8  0.6  0.8   Alkaline Phos 38 - 126 U/L 70  71  58   AST 15 - 41 U/L 20  22  20    ALT 0 - 44 U/L 18  18  18

## 2023-07-17 ENCOUNTER — Encounter: Payer: Self-pay | Admitting: Internal Medicine

## 2023-07-17 ENCOUNTER — Ambulatory Visit (INDEPENDENT_AMBULATORY_CARE_PROVIDER_SITE_OTHER): Payer: Medicare Other | Admitting: Internal Medicine

## 2023-07-17 VITALS — BP 124/70 | HR 79 | Temp 98.1°F | Ht 62.0 in | Wt 127.0 lb

## 2023-07-17 DIAGNOSIS — Z2821 Immunization not carried out because of patient refusal: Secondary | ICD-10-CM

## 2023-07-17 DIAGNOSIS — E559 Vitamin D deficiency, unspecified: Secondary | ICD-10-CM

## 2023-07-17 DIAGNOSIS — I119 Hypertensive heart disease without heart failure: Secondary | ICD-10-CM

## 2023-07-17 DIAGNOSIS — Z79899 Other long term (current) drug therapy: Secondary | ICD-10-CM | POA: Diagnosis not present

## 2023-07-17 DIAGNOSIS — E2839 Other primary ovarian failure: Secondary | ICD-10-CM

## 2023-07-17 DIAGNOSIS — F5104 Psychophysiologic insomnia: Secondary | ICD-10-CM | POA: Diagnosis not present

## 2023-07-17 DIAGNOSIS — R7989 Other specified abnormal findings of blood chemistry: Secondary | ICD-10-CM | POA: Diagnosis not present

## 2023-07-17 DIAGNOSIS — G250 Essential tremor: Secondary | ICD-10-CM

## 2023-07-17 DIAGNOSIS — E7800 Pure hypercholesterolemia, unspecified: Secondary | ICD-10-CM

## 2023-07-17 DIAGNOSIS — I2583 Coronary atherosclerosis due to lipid rich plaque: Secondary | ICD-10-CM | POA: Diagnosis not present

## 2023-07-17 DIAGNOSIS — I2510 Atherosclerotic heart disease of native coronary artery without angina pectoris: Secondary | ICD-10-CM | POA: Diagnosis not present

## 2023-07-17 MED ORDER — CLONAZEPAM 0.5 MG PO TABS: 0.5000 mg | ORAL_TABLET | Freq: Every day | ORAL | 1 refills | Status: DC | PRN

## 2023-07-17 NOTE — Assessment & Plan Note (Signed)
Chronic, will check lipid panel today. She declines statin therapy. She verbally understands the risk of an acute cardiac event. She no longer feels she "needs" the medication.

## 2023-07-17 NOTE — Assessment & Plan Note (Signed)
Chronic, fair control off of meds. She does not wish to take medication to control this. Encouraged to follow low sodium, heart healthy diet.

## 2023-07-17 NOTE — Patient Instructions (Addendum)
Golden milk--almond milk, ginger,turmeric, black pepper, cinnamon, drink warm at bedtime

## 2023-07-17 NOTE — Assessment & Plan Note (Signed)
I will check a vitamin D level and address as needed.

## 2023-07-17 NOTE — Progress Notes (Signed)
I,Jameka J Llittleton, CMA,acting as a Neurosurgeon for Gwynneth Aliment, MD.,have documented all relevant documentation on the behalf of Gwynneth Aliment, MD,as directed by  Gwynneth Aliment, MD while in the presence of Gwynneth Aliment, MD.  Subjective:  Patient ID: Carolyn Martin , female    DOB: [birth year] , 72 y.o.   MRN: [id number]  Chief Complaint  Patient presents with   Hypertension    HPI  Pt is here for a follow up on a b/p check. She reports compliance with meds. She denies having any headaches, chest pain and shortness of breath.    Hypertension This is a chronic problem. The current episode started more than 1 year ago. The problem has been gradually improving since onset. The problem is controlled. Pertinent negatives include no blurred vision, chest pain, palpitations or shortness of breath. Risk factors for coronary artery disease include post-menopausal state and dyslipidemia. Past treatments include lifestyle changes. The current treatment provides moderate improvement. There are no compliance problems.  Hypertensive end-organ damage includes CAD/MI.  Hyperlipidemia This is a chronic problem. The problem is controlled. Pertinent negatives include no chest pain or shortness of breath. Risk factors for coronary artery disease include dyslipidemia and post-menopausal.     Past Medical History:  Diagnosis Date   Anginal pain (HCC) 05/14/2008   atypical chest pain-- Exercise  Myoview  EF 61% ,mod to severe ischemia Basal Anterior ,Anteroseptal,Mid Anterior,Mid Anteroseptal, Apical Anterior and Apical Septal regions   LV normal    Coronary artery disease 06/23/2010   Echo EF =>55%,LV normal   Hyperlipidemia    Hypertension    Sleep apnea    Tremor, essential 02/19/2016     Family History  Problem Relation Age of Onset   Diabetes Mother    Hypertension Mother    Kidney failure Mother    Transient ischemic attack Father    Coronary artery disease Father    Dementia Father     Cancer Brother      Current Outpatient Medications:    Ascorbic Acid (VITAMIN C PO), Take 500 mg by mouth daily. , Disp: , Rfl:    B Complex Vitamins (B COMPLEX 100 PO), Take 100 mg by mouth daily., Disp: , Rfl:    Blood Pressure Monitoring (OMRON 5 SERIES BP MONITOR) DEVI, , Disp: , Rfl:    Cholecalciferol (VITAMIN D-3 PO), Take 5,000 Units by mouth daily. , Disp: , Rfl:    Coenzyme Q10 (CO Q 10 PO), Take 100 mg by mouth daily. , Disp: , Rfl:    magnesium oxide (MAG-OX) 400 (240 Mg) MG tablet, Take 400 mg by mouth daily., Disp: , Rfl:    clonazePAM (KLONOPIN) 0.5 MG tablet, Take 1 tablet (0.5 mg total) by mouth daily as needed for anxiety., Disp: 30 tablet, Rfl: 1   Allergies  Allergen Reactions   Iodine Hives   Sulfa Antibiotics      Review of Systems  Constitutional: Negative.   Eyes:  Negative for blurred vision.  Respiratory: Negative.  Negative for shortness of breath.   Cardiovascular: Negative.  Negative for chest pain and palpitations.  Gastrointestinal: Negative.   Skin: Negative.   Neurological: Negative.   Psychiatric/Behavioral: Negative.       Today's Vitals   07/17/23 1030  BP: 124/70  Pulse: 79  Temp: 98.1 F (36.7 C)  Weight: 127 lb (57.6 kg)  Height: 5\' 2"  (1.575 m)  PainSc: 0-No pain   Body mass index is 23.23 kg/m.  Wt Readings from Last 3 Encounters:  07/17/23 127 lb (57.6 kg)  05/17/23 125 lb 3.2 oz (56.8 kg)  01/11/23 121 lb 3.2 oz (55 kg)    The 10-year ASCVD risk score (Arnett DK, et al., 2019) is: 10.5%   Values used to calculate the score:     Age: 81 years     Sex: Female     Is Non-Hispanic African American: No     Diabetic: No     Tobacco smoker: No     Systolic Blood Pressure: 124 mmHg     Is BP treated: No     HDL Cholesterol: 64 mg/dL     Total Cholesterol: 272 mg/dL  Objective:  Physical Exam Vitals and nursing note reviewed.  Constitutional:      Appearance: Normal appearance.  HENT:     Head: Normocephalic and  atraumatic.  Eyes:     Extraocular Movements: Extraocular movements intact.  Cardiovascular:     Rate and Rhythm: Normal rate and regular rhythm.     Heart sounds: Normal heart sounds.  Pulmonary:     Effort: Pulmonary effort is normal.     Breath sounds: Normal breath sounds.  Musculoskeletal:     Cervical back: Normal range of motion.  Skin:    General: Skin is warm.  Neurological:     General: No focal deficit present.     Mental Status: She is alert.  Psychiatric:        Mood and Affect: Mood normal.        Behavior: Behavior normal.         Assessment And Plan:  Hypertensive heart disease without heart failure Assessment & Plan: Chronic, fair control off of meds. She does not wish to take medication to control this. Encouraged to follow low sodium, heart healthy diet.    Coronary artery disease due to lipid rich plaque Assessment & Plan: Chronic, she is s/p PCI in 2009.  She is without chest pain and shortness of breath.  She does not wish to take rx meds including B-blocker and statin therapy. She will take ASA 81mg  with MWF dosing.   Orders: -     Lipid panel  Pure hypercholesterolemia Assessment & Plan: Chronic, will check lipid panel today. She declines statin therapy. She verbally understands the risk of an acute cardiac event. She no longer feels she "needs" the medication.   Orders: -     Lipid panel -     TSH + free T4  Chronic insomnia Assessment & Plan: Chronic, controlled with clonazepam taken at least 2-3 days weekly. PDMP reviewed. Encouraged to develop a bedtime routine. We also discussed use of golden milk taken nightly.    Benign head tremor -     clonazePAM; Take 1 tablet (0.5 mg total) by mouth daily as needed for anxiety.  Dispense: 30 tablet; Refill: 1  High serum vitamin D Assessment & Plan: I will check a vitamin D level and address as needed.    Estrogen deficiency Assessment & Plan: She declines getting bone density at this time.     COVID-19 vaccination declined  Herpes zoster vaccination declined  Influenza vaccination declined  Drug therapy  Other orders -     VITAMIN D 25 Hydroxy (Vit-D Deficiency, Fractures) -     Specimen status report    Return if symptoms worsen or fail to improve, for keep her next appt as scheduled .  Patient was given opportunity to ask questions. Patient verbalized  understanding of the plan and was able to repeat key elements of the plan. All questions were answered to their satisfaction.    I, Gwynneth Aliment, MD, have reviewed all documentation for this visit. The documentation on 07/17/23 for the exam, diagnosis, procedures, and orders are all accurate and complete.   IF YOU HAVE BEEN REFERRED TO A SPECIALIST, IT MAY TAKE 1-2 WEEKS TO SCHEDULE/PROCESS THE REFERRAL. IF YOU HAVE NOT HEARD FROM US/SPECIALIST IN TWO WEEKS, PLEASE GIVE [place] A CALL AT [phone].

## 2023-07-17 NOTE — Assessment & Plan Note (Signed)
She declines getting bone density at this time.

## 2023-07-17 NOTE — Assessment & Plan Note (Signed)
Chronic, controlled with clonazepam taken at least 2-3 days weekly. PDMP reviewed. Encouraged to develop a bedtime routine. We also discussed use of golden milk taken nightly.

## 2023-07-17 NOTE — Assessment & Plan Note (Addendum)
Chronic, she is s/p PCI in 2009.  She is without chest pain and shortness of breath.  She does not wish to take rx meds including B-blocker and statin therapy. She will take ASA 81mg  with MWF dosing.

## 2023-07-18 LAB — TSH+FREE T4
Free T4: 1.2 ng/dL (ref 0.82–1.77)
TSH: 0.877 u[IU]/mL (ref 0.450–4.500)

## 2023-07-18 LAB — LIPID PANEL
Chol/HDL Ratio: 4.3 {ratio} (ref 0.0–4.4)
Cholesterol, Total: 272 mg/dL — ABNORMAL HIGH (ref 100–199)
HDL: 64 mg/dL (ref >39)
LDL Chol Calc (NIH): 173 mg/dL — ABNORMAL HIGH (ref 0–99)
Triglycerides: 191 mg/dL — ABNORMAL HIGH (ref 0–149)
VLDL Cholesterol Cal: 35 mg/dL (ref 5–40)

## 2023-07-19 ENCOUNTER — Encounter: Payer: Self-pay | Admitting: Internal Medicine

## 2023-07-19 LAB — SPECIMEN STATUS REPORT

## 2023-07-19 LAB — VITAMIN D 25 HYDROXY (VIT D DEFICIENCY, FRACTURES): Vit D, 25-Hydroxy: 66.9 ng/mL (ref 30.0–100.0)

## 2023-07-21 ENCOUNTER — Encounter: Payer: Self-pay | Admitting: Internal Medicine

## 2023-07-22 NOTE — Assessment & Plan Note (Signed)
I will check a vitamin D level and address as needed.

## 2023-09-22 ENCOUNTER — Ambulatory Visit (INDEPENDENT_AMBULATORY_CARE_PROVIDER_SITE_OTHER): Payer: Medicare Other

## 2023-09-22 DIAGNOSIS — Z0000 Encounter for general adult medical examination without abnormal findings: Secondary | ICD-10-CM | POA: Diagnosis not present

## 2023-09-22 NOTE — Patient Instructions (Signed)

## 2023-09-22 NOTE — Progress Notes (Signed)
Subjective:   Carolyn Martin is a 72 y.o. female who presents for Medicare Annual (Subsequent) preventive examination.  Visit Complete: Virtual I connected with  Berton Mount on 09/22/23 by a audio enabled telemedicine application and verified that I am speaking with the correct person using two identifiers.  Patient Location: Home  Provider Location: Office/Clinic  I discussed the limitations of evaluation and management by telemedicine. The patient expressed understanding and agreed to proceed.  Vital Signs: Because this visit was a virtual/telehealth visit, some criteria may be missing or patient reported. Any vitals not documented were not able to be obtained and vitals that have been documented are patient reported.  Patient Medicare AWV questionnaire was completed by the patient on 09/22/2023; I have confirmed that all information answered by patient is correct and no changes since this date.  Cardiac Risk Factors include: hypertension;family history of premature cardiovascular disease     Objective:    Today's Vitals   There is no height or weight on file to calculate BMI.     05/17/2023    9:49 AM 07/14/2022   11:19 AM 07/22/2021    9:02 AM 07/27/2020   10:42 AM 07/16/2020    9:00 AM 03/07/2020   11:19 AM 07/10/2019    8:49 AM  Advanced Directives  Does Patient Have a Medical Advance Directive? Yes Yes Yes Yes Yes Yes Yes  Type of Estate agent of [place];Living will  Healthcare Power of [place];Living will Healthcare Power of [place];Living will Healthcare Power of [place];Living will Healthcare Power of [place];Living will Healthcare Power of [place];Living will  Does patient want to make changes to medical advance directive?    No - Patient declined  No - Patient declined   Copy of Healthcare Power of Attorney in Chart?   No - copy requested    No - copy requested    Current Medications (verified) Outpatient Encounter Medications as  of 09/22/2023  Medication Sig   Ascorbic Acid (VITAMIN C PO) Take 500 mg by mouth daily.    B Complex Vitamins (B COMPLEX 100 PO) Take 100 mg by mouth daily.   Blood Pressure Monitoring (OMRON 5 SERIES BP MONITOR) DEVI    Cholecalciferol (VITAMIN D-3 PO) Take 5,000 Units by mouth daily.    Coenzyme Q10 (CO Q 10 PO) Take 100 mg by mouth daily.    magnesium oxide (MAG-OX) 400 (240 Mg) MG tablet Take 400 mg by mouth daily.   clonazePAM (KLONOPIN) 0.5 MG tablet Take 1 tablet (0.5 mg total) by mouth daily as needed for anxiety.   No facility-administered encounter medications on file as of 09/22/2023.    Allergies (verified) Iodine and Sulfa antibiotics   History: Past Medical History:  Diagnosis Date   Anginal pain (HCC) 05/14/2008   atypical chest pain-- Exercise  Myoview  EF 61% ,mod to severe ischemia Basal Anterior ,Anteroseptal,Mid Anterior,Mid Anteroseptal, Apical Anterior and Apical Septal regions   LV normal    Coronary artery disease 06/23/2010   Echo EF =>55%,LV normal   Hyperlipidemia    Hypertension    Sleep apnea    Tremor, essential 02/19/2016   Past Surgical History:  Procedure Laterality Date   CARDIAC CATHETERIZATION  05/15/2008   2 areas of LAD   CORONARY ANGIOPLASTY WITH STENT PLACEMENT  05/15/2008    proximal LAD -MiniVision stent 2.5 x 12 and mid LAD 2 overlapping MiniVision 2.0 x 15   Family History  Problem Relation Age of Onset  Diabetes Mother    Hypertension Mother    Kidney failure Mother    Transient ischemic attack Father    Coronary artery disease Father    Dementia Father    Cancer Brother    Social History   Socioeconomic History   Marital status: Married    Spouse name: Hessie Knows   Number of children: 2   Years of education: 12   Highest education level: Not on file  Occupational History   Occupation: Retired    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[place] [employer]  Tobacco Use   Smoking status: Former    Current packs/day: 0.00    Average packs/day: 1 pack/day for  26.7 years (26.7 ttl pk-yrs)    Types: Cigarettes    Start date: 04/06/1970    Quit date: 12/16/1996    Years since quitting: 26.7   Smokeless tobacco: Never   Tobacco comments:    1ppd x 7 years,   Vaping Use   Vaping status: Never Used  Substance and Sexual Activity   Alcohol use: No   Drug use: No   Sexual activity: Yes  Other Topics Concern   Not on file  Social History Narrative   Lives w/ husband   Right-handed   Drinks about 12 oz caffeinated beverage per day   Social Determinants of Health   Financial Resource Strain: Low Risk  (09/22/2023)   Overall Financial Resource Strain (CARDIA)    Difficulty of Paying Living Expenses: Not hard at all  Food Insecurity: No Food Insecurity (09/22/2023)   Hunger Vital Sign    Worried About Running Out of Food in the Last Year: Never true    Ran Out of Food in the Last Year: Never true  Transportation Needs: No Transportation Needs (09/22/2023)   PRAPARE - Administrator, Civil Service (Medical): No    Lack of Transportation (Non-Medical): No  Physical Activity: Insufficiently Active (09/22/2023)   Exercise Vital Sign    Days of Exercise per Week: 4 days    Minutes of Exercise per Session: 30 min  Stress: No Stress Concern Present (09/22/2023)   [employer] of Occupational Health - Occupational Stress Questionnaire    Feeling of Stress : Only a little  Social Connections: Moderately Integrated (09/22/2023)   Social Connection and Isolation Panel [NHANES]    Frequency of Communication with Friends and Family: More than three times a week    Frequency of Social Gatherings with Friends and Family: More than three times a week    Attends Religious Services: More than 4 times per year    Active Member of [employer] or Organizations: No    Attends Engineer, structural: Never    Marital Status: Married    Tobacco Counseling Counseling given: Yes Tobacco comments: 1ppd x 7 years,    Clinical Intake:  Pre-visit  preparation completed: Yes  Pain : No/denies pain     Nutritional Risks: None Diabetes: No  How often do you need to have someone help you when you read instructions, pamphlets, or other written materials from your doctor or pharmacy?: 1 - Never What is the last grade level you completed in school?: 12th Grade  Interpreter Needed?: No  Information entered by :: [place] [facility]   Activities of Daily Living    09/22/2023   11:28 AM  In your present state of health, do you have any difficulty performing the following activities:  Hearing? 0  Vision? 0  Difficulty concentrating or making decisions? 0  Walking  or climbing stairs? 0  Dressing or bathing? 0  Doing errands, shopping? 0  Preparing Food and eating ? N  Using the Toilet? N  In the past six months, have you accidently leaked urine? N  Do you have problems with loss of bowel control? N  Managing your Medications? N  Managing your Finances? N  Housekeeping or managing your Housekeeping? N    Patient Care Team: Dorothyann Peng, MD as PCP - General (Internal Medicine) [name], MD as Consulting Physician (Oncology)  Indicate any recent Medical Services you may have received from other than [facility] providers in the past year (date may be approximate).     Assessment:   This is a routine wellness examination for Carolyn Martin.  Hearing/Vision screen No results found.   Goals Addressed               This Visit's Progress     Stay Healthy (pt-stated)        She would like to implement more physical activity. Along with not gaining more weight.       Depression Screen    09/22/2023   11:32 AM 07/17/2023   10:30 AM 01/11/2023    9:37 AM 09/02/2022    9:05 AM 07/22/2021    9:03 AM 07/16/2020    9:01 AM 07/16/2020    8:51 AM  PHQ 2/9 Scores  PHQ - 2 Score 0 0 0 0 0 0 0  PHQ- 9 Score  0         Fall Risk    09/22/2023   11:33 AM 09/02/2022    9:06 AM 07/22/2021    9:03 AM 07/16/2020    9:00 AM 07/16/2020    8:53 AM   Fall Risk   Falls in the past year? 0 0 0 0 0  Number falls in past yr: 0 0     Injury with Fall? 0 0     Risk for fall due to : No Fall Risks No Fall Risks Medication side effect Medication side effect   Follow up Falls evaluation completed Falls evaluation completed Falls evaluation completed;Education provided;Falls prevention discussed Falls evaluation completed;Education provided;Falls prevention discussed     MEDICARE RISK AT HOME:    TIMED UP AND GO:  Was the test performed?  No    Cognitive Function:        09/22/2023   11:33 AM 09/02/2022    9:08 AM 07/22/2021    9:04 AM 07/16/2020    9:02 AM 07/10/2019    8:52 AM  6CIT Screen  What Year? 0 points 0 points 0 points 0 points 0 points  What month? 0 points 0 points 0 points 0 points 0 points  What time?  3 points 0 points 0 points 0 points  Count back from 20 2 points 0 points 0 points 0 points 0 points  Months in reverse 0 points 0 points 2 points 0 points 0 points  Repeat phrase 4 points 0 points 0 points 0 points 0 points  Total Score  3 points 2 points 0 points 0 points    Immunizations Immunization History  Administered Date(s) Administered   PNEUMOCOCCAL CONJUGATE-20 01/11/2023   Pneumococcal Polysaccharide-23 07/19/2017   Tdap 02/24/2018    TDAP status: Up to date  Flu Vaccine status: Up to date  Pneumococcal vaccine status: Up to date  Covid-19 vaccine status: Declined, Education has been provided regarding the importance of this vaccine but patient still declined. Advised may receive this  vaccine at local pharmacy or Health Dept.or vaccine clinic. Aware to provide a copy of the vaccination record if obtained from local pharmacy or Health Dept. Verbalized acceptance and understanding.  Qualifies for Shingles Vaccine? Yes   Zostavax completed No   Shingrix Completed?: No.    Education has been provided regarding the importance of this vaccine. Patient has been advised to call insurance company to  determine out of pocket expense if they have not yet received this vaccine. Advised may also receive vaccine at local pharmacy or Health Dept. Verbalized acceptance and understanding.  Screening Tests Health Maintenance  Topic Date Due   COVID-19 Vaccine (1) Never done   Zoster Vaccines- Shingrix (1 of 2) 10/16/2023 (Originally 03/29/1970)   INFLUENZA VACCINE  01/15/2024 (Originally 05/18/2023)   MAMMOGRAM  09/25/2023   Medicare Annual Wellness (AWV)  09/21/2024   DTaP/Tdap/Td (2 - Td or Tdap) 02/25/2028   Colonoscopy  05/30/2028   Pneumonia Vaccine 21+ Years old  Completed   DEXA SCAN  Completed   Hepatitis C Screening  Completed   HPV VACCINES  Aged Out    Health Maintenance  Health Maintenance Due  Topic Date Due   COVID-19 Vaccine (1) Never done    Lung Cancer Screening: (Low Dose CT Chest recommended if Age 69-80 years, 20 pack-year currently smoking OR have quit w/in 15years.) does qualify.   Lung Cancer Screening Referral: N/A  Additional Screening:  Hepatitis C Screening: does qualify; Completed 07/27/20  Dental Screening: Recommended annual dental exams for proper oral hygiene   Community Resource Referral / Chronic Care Management: CRR required this visit?  No   CCM required this visit?  No     Plan:     I have personally reviewed and noted the following in the patient's chart:   Medical and social history Use of alcohol, tobacco or illicit drugs  Current medications and supplements including opioid prescriptions. Patient is not currently taking opioid prescriptions. Functional ability and status Nutritional status Physical activity Advanced directives List of other physicians Hospitalizations, surgeries, and ER visits in previous 12 months Vitals Screenings to include cognitive, depression, and falls Referrals and appointments  In addition, I have reviewed and discussed with patient certain preventive protocols, quality metrics, and best practice  recommendations. A written personalized care plan for preventive services as well as general preventive health recommendations were provided to patient.     Coolidge Breeze, CMA   09/22/2023   After Visit Summary: (MyChart) Due to this being a telephonic visit, the after visit summary with patients personalized plan was offered to patient via MyChart   Nurse Notes: completed.

## 2023-11-16 NOTE — Assessment & Plan Note (Signed)
06/08/22 peripheral blood flowcytometry  Chronic lymphocytic leukemia (CLL), positive for CD20, CD22 and CD19, negative for CD38 13q deletion. +IgVH Somatic Hypermutation No constitutional symptoms. No anemia, thrombocytopenia, RAI Stage 0 Recommend watchful waiting.

## 2023-11-17 ENCOUNTER — Encounter: Payer: Self-pay | Admitting: Oncology

## 2023-11-17 ENCOUNTER — Inpatient Hospital Stay (HOSPITAL_BASED_OUTPATIENT_CLINIC_OR_DEPARTMENT_OTHER): Payer: Medicare Other | Admitting: Oncology

## 2023-11-17 ENCOUNTER — Inpatient Hospital Stay: Payer: Medicare Other | Attending: Oncology

## 2023-11-17 VITALS — BP 121/63 | HR 84 | Temp 97.7°F | Resp 18 | Wt 125.5 lb

## 2023-11-17 DIAGNOSIS — Z87891 Personal history of nicotine dependence: Secondary | ICD-10-CM | POA: Diagnosis not present

## 2023-11-17 DIAGNOSIS — C9110 Chronic lymphocytic leukemia of B-cell type not having achieved remission: Secondary | ICD-10-CM | POA: Diagnosis not present

## 2023-11-17 LAB — CBC WITH DIFFERENTIAL (CANCER CENTER ONLY)
Abs Immature Granulocytes: 0.02 10*3/uL (ref 0.00–0.07)
Basophils Absolute: 0.1 10*3/uL (ref 0.0–0.1)
Basophils Relative: 0 %
Eosinophils Absolute: 0.1 10*3/uL (ref 0.0–0.5)
Eosinophils Relative: 1 %
HCT: 39.5 % (ref 36.0–46.0)
Hemoglobin: 12.9 g/dL (ref 12.0–15.0)
Immature Granulocytes: 0 %
Lymphocytes Relative: 67 %
Lymphs Abs: 7.5 10*3/uL — ABNORMAL HIGH (ref 0.7–4.0)
MCH: 31.1 pg (ref 26.0–34.0)
MCHC: 32.7 g/dL (ref 30.0–36.0)
MCV: 95.2 fL (ref 80.0–100.0)
Monocytes Absolute: 0.4 10*3/uL (ref 0.1–1.0)
Monocytes Relative: 4 %
Neutro Abs: 3.1 10*3/uL (ref 1.7–7.7)
Neutrophils Relative %: 28 %
Platelet Count: 298 10*3/uL (ref 150–400)
RBC: 4.15 MIL/uL (ref 3.87–5.11)
RDW: 12.6 % (ref 11.5–15.5)
WBC Count: 11.2 10*3/uL — ABNORMAL HIGH (ref 4.0–10.5)
nRBC: 0 % (ref 0.0–0.2)

## 2023-11-17 LAB — CMP (CANCER CENTER ONLY)
ALT: 16 U/L (ref 0–44)
AST: 19 U/L (ref 15–41)
Albumin: 4 g/dL (ref 3.5–5.0)
Alkaline Phosphatase: 65 U/L (ref 38–126)
Anion gap: 8 (ref 5–15)
BUN: 17 mg/dL (ref 8–23)
CO2: 26 mmol/L (ref 22–32)
Calcium: 9.2 mg/dL (ref 8.9–10.3)
Chloride: 104 mmol/L (ref 98–111)
Creatinine: 0.87 mg/dL (ref 0.44–1.00)
GFR, Estimated: >60 mL/min (ref >60)
Glucose, Bld: 90 mg/dL (ref 70–99)
Potassium: 3.8 mmol/L (ref 3.5–5.1)
Sodium: 138 mmol/L (ref 135–145)
Total Bilirubin: 0.9 mg/dL (ref 0.0–1.2)
Total Protein: 7 g/dL (ref 6.5–8.1)

## 2023-11-17 LAB — LACTATE DEHYDROGENASE: LDH: 103 U/L (ref 98–192)

## 2023-11-17 NOTE — Progress Notes (Signed)
Hematology/Oncology Progress note [phone] [phone]     CHIEF COMPLAINTS/REASON FOR VISIT:  Follow up of leukocytosis  ASSESSMENT & PLAN:   Cancer Staging  CLL (chronic lymphocytic leukemia) (HCC) Staging form: Chronic Lymphocytic Leukemia / Small Lymphocytic Lymphoma, AJCC 8th Edition - Clinical stage from 07/14/2022: Modified Rai Stage 0 (Modified Rai risk: Low, Lymphocytosis: Present, Adenopathy: Absent, Organomegaly: Absent, Anemia: Absent, Thrombocytopenia: Absent) - Signed by [name], MD on 07/14/2022   CLL (chronic lymphocytic leukemia) (HCC) 06/08/22 peripheral blood flowcytometry  Chronic lymphocytic leukemia (CLL), positive for CD20, CD22 and CD19, negative for CD38 13q deletion. +IgVH Somatic Hypermutation No constitutional symptoms. No anemia, thrombocytopenia, RAI Stage 0 Recommend watchful waiting.     Orders Placed This Encounter  Procedures   CMP (Cancer Center only)    Standing Status:   Future    Expected Date:   05/16/2024    Expiration Date:   11/16/2024   CBC with Differential (Cancer Center Only)    Standing Status:   Future    Expected Date:   05/16/2024    Expiration Date:   11/16/2024   Lactate dehydrogenase    Standing Status:   Future    Expected Date:   05/16/2024    Expiration Date:   11/16/2024   Follow up in 6 months. All questions were answered. The patient knows to call the clinic with any problems, questions or concerns.  [name], MD, PhD [facility] Hematology Oncology 11/17/2023     HISTORY OF PRESENTING ILLNESS:  Carolyn Martin is a  73 y.o.  female with PMH listed below who was referred to me for evaluation of leukocytosis Reviewed patient' recent labs obtained by PCP.   07/16/2020 CBC showed elevated white count of 9.6, lymphocyte 6.7. Previous lab records reviewed. Leukocytosis onset of chronic, duration is since at least September 2020.  No aggravating or elevated factors. Associated symptoms or  signs:  Denies weight loss, fever, chills,night sweats.  Denies fatigue Smoking history: She is a former smoker.  She quit smoking 2008. History of recent oral steroid use or steroid injection: Denies History of recent infection: Patient had recent COVID-19 infection in September 2021.  Patient got Mab infusion. Autoimmune disease history.  Denies   She reports that her mom has a history of CLL + CAD, s/p stenting.    INTERVAL HISTORY Carolyn Martin is a 73 y.o. female who has above history reviewed by me today presents for follow up visit for management of Rai Stage 0 CLL Weight is stable. She denies fever. One episode of sweating.  No other new complaints.    Review of Systems  Constitutional:  Negative for appetite change, chills, fatigue and fever.  HENT:   Negative for hearing loss and voice change.   Eyes:  Negative for eye problems.  Respiratory:  Negative for chest tightness and cough.   Cardiovascular:  Negative for chest pain.  Gastrointestinal:  Negative for abdominal distention, abdominal pain and blood in stool.  Endocrine: Negative for hot flashes.  Genitourinary:  Negative for difficulty urinating and frequency.   Musculoskeletal:  Negative for arthralgias.  Skin:  Negative for itching and rash.  Neurological:  Negative for extremity weakness.  Hematological:  Negative for adenopathy.  Psychiatric/Behavioral:  Negative for confusion.      MEDICAL HISTORY:  Past Medical History:  Diagnosis Date   Anginal pain (HCC) 05/14/2008   atypical chest pain-- Exercise  Myoview  EF 61% ,mod to severe ischemia Basal Anterior ,  Anteroseptal,Mid Anterior,Mid Anteroseptal, Apical Anterior and Apical Septal regions   LV normal    Coronary artery disease 06/23/2010   Echo EF =>55%,LV normal   Hyperlipidemia    Hypertension    Sleep apnea    Tremor, essential 02/19/2016    SURGICAL HISTORY: Past Surgical History:  Procedure Laterality Date   CARDIAC CATHETERIZATION   05/15/2008   2 areas of LAD   CORONARY ANGIOPLASTY WITH STENT PLACEMENT  05/15/2008    proximal LAD -MiniVision stent 2.5 x 12 and mid LAD 2 overlapping MiniVision 2.0 x 15    SOCIAL HISTORY: Social History   Socioeconomic History   Marital status: Married    Spouse name: Freight forwarder   Number of children: 2   Years of education: 12   Highest education level: Not on file  Occupational History   Occupation: Retired    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[employer]  Tobacco Use   Smoking status: Former    Current packs/day: 0.00    Average packs/day: 1 pack/day for 26.7 years (26.7 ttl pk-yrs)    Types: Cigarettes    Start date: 04/06/1970    Quit date: 12/16/1996    Years since quitting: 26.9   Smokeless tobacco: Never   Tobacco comments:    1ppd x 7 years,   Vaping Use   Vaping status: Never Used  Substance and Sexual Activity   Alcohol use: No   Drug use: No   Sexual activity: Yes  Other Topics Concern   Not on file  Social History Narrative   Lives w/ husband   Right-handed   Drinks about 12 oz caffeinated beverage per day   Social Drivers of Health   Financial Resource Strain: Low Risk  (09/22/2023)   Overall Financial Resource Strain (CARDIA)    Difficulty of Paying Living Expenses: Not hard at all  Food Insecurity: No Food Insecurity (09/22/2023)   Hunger Vital Sign    Worried About Running Out of Food in the Last Year: Never true    Ran Out of Food in the Last Year: Never true  Transportation Needs: No Transportation Needs (09/22/2023)   PRAPARE - Administrator, Civil Service (Medical): No    Lack of Transportation (Non-Medical): No  Physical Activity: Insufficiently Active (09/22/2023)   Exercise Vital Sign    Days of Exercise per Week: 4 days    Minutes of Exercise per Session: 30 min  Stress: No Stress Concern Present (09/22/2023)   [employer] of Occupational Health - Occupational Stress Questionnaire    Feeling of Stress : Only a little  Social Connections:  Moderately Integrated (09/22/2023)   Social Connection and Isolation Panel [NHANES]    Frequency of Communication with Friends and Family: More than three times a week    Frequency of Social Gatherings with Friends and Family: More than three times a week    Attends Religious Services: More than 4 times per year    Active Member of [employer] or Organizations: No    Attends Banker Meetings: Never    Marital Status: Married  Catering manager Violence: Not At Risk (09/22/2023)   Humiliation, Afraid, Rape, and Kick questionnaire    Fear of Current or Ex-Partner: No    Emotionally Abused: No    Physically Abused: No    Sexually Abused: No    FAMILY HISTORY: Family History  Problem Relation Age of Onset   Diabetes Mother    Hypertension Mother    Kidney failure Mother  Transient ischemic attack Father    Coronary artery disease Father    Dementia Father    Cancer Brother     ALLERGIES:  is allergic to iodine and sulfa antibiotics.  MEDICATIONS:  Current Outpatient Medications  Medication Sig Dispense Refill   Ascorbic Acid (VITAMIN C PO) Take 500 mg by mouth daily.      B Complex Vitamins (B COMPLEX 100 PO) Take 100 mg by mouth daily.     Blood Pressure Monitoring (OMRON 5 SERIES BP MONITOR) DEVI      Cholecalciferol (VITAMIN D-3 PO) Take 5,000 Units by mouth daily.      clonazePAM (KLONOPIN) 0.5 MG tablet Take 1 tablet (0.5 mg total) by mouth daily as needed for anxiety. 30 tablet 1   Coenzyme Q10 (CO Q 10 PO) Take 100 mg by mouth daily.      magnesium oxide (MAG-OX) 400 (240 Mg) MG tablet Take 400 mg by mouth daily.     No current facility-administered medications for this visit.     PHYSICAL EXAMINATION: ECOG PERFORMANCE STATUS: 0 - Asymptomatic Vitals:   11/17/23 0953  BP: 121/63  Pulse: 84  Resp: 18  Temp: 97.7 F (36.5 C)   Filed Weights   11/17/23 0953  Weight: 125 lb 8 oz (56.9 kg)    Physical Exam Constitutional:      General: She is not in  acute distress. HENT:     Head: Normocephalic and atraumatic.  Eyes:     General: No scleral icterus. Cardiovascular:     Rate and Rhythm: Normal rate.  Pulmonary:     Effort: Pulmonary effort is normal. No respiratory distress.     Breath sounds: No wheezing.  Abdominal:     General: Bowel sounds are normal. There is no distension.     Palpations: Abdomen is soft.     Tenderness: There is no abdominal tenderness.  Musculoskeletal:        General: No deformity. Normal range of motion.     Cervical back: Normal range of motion and neck supple.  Skin:    General: Skin is warm and dry.  Neurological:     Mental Status: She is alert and oriented to person, place, and time. Mental status is at baseline.     Cranial Nerves: No cranial nerve deficit.  Psychiatric:        Mood and Affect: Mood normal.     Labs     Latest Ref Rng & Units 11/17/2023    9:42 AM 05/17/2023    9:38 AM 11/16/2022    9:25 AM  CBC  WBC 4.0 - 10.5 K/uL 11.2  14.4  12.6   Hemoglobin 12.0 - 15.0 g/dL 40.9  81.1  91.4   Hematocrit 36.0 - 46.0 % 39.5  40.6  40.6   Platelets 150 - 400 K/uL 298  207  212       Latest Ref Rng & Units 11/17/2023    9:42 AM 05/17/2023    9:38 AM 11/16/2022    9:25 AM  CMP  Glucose 70 - 99 mg/dL 90  92  90   BUN 8 - 23 mg/dL 17  19  23    Creatinine 0.44 - 1.00 mg/dL 7.82  9.56  2.13   Sodium 135 - 145 mmol/L 138  136  138   Potassium 3.5 - 5.1 mmol/L 3.8  4.0  4.2   Chloride 98 - 111 mmol/L 104  103  103   CO2 22 -  32 mmol/L 26  26  27    Calcium 8.9 - 10.3 mg/dL 9.2  9.4  9.2   Total Protein 6.5 - 8.1 g/dL 7.0  7.3  7.1   Total Bilirubin 0.0 - 1.2 mg/dL 0.9  0.8  0.6   Alkaline Phos 38 - 126 U/L 65  70  71   AST 15 - 41 U/L 19  20  22    ALT 0 - 44 U/L 16  18  18      RADIOGRAPHIC STUDIES: I have personally reviewed the radiological images as listed and agreed with the findings in the report. No results found.  LABORATORY DATA:  I have reviewed the data as listed     Latest Ref Rng & Units 11/17/2023    9:42 AM 05/17/2023    9:38 AM 11/16/2022    9:25 AM  CBC  WBC 4.0 - 10.5 K/uL 11.2  14.4  12.6   Hemoglobin 12.0 - 15.0 g/dL 08.6  57.8  46.9   Hematocrit 36.0 - 46.0 % 39.5  40.6  40.6   Platelets 150 - 400 K/uL 298  207  212       Latest Ref Rng & Units 11/17/2023    9:42 AM 05/17/2023    9:38 AM 11/16/2022    9:25 AM  CMP  Glucose 70 - 99 mg/dL 90  92  90   BUN 8 - 23 mg/dL 17  19  23    Creatinine 0.44 - 1.00 mg/dL 6.29  5.28  4.13   Sodium 135 - 145 mmol/L 138  136  138   Potassium 3.5 - 5.1 mmol/L 3.8  4.0  4.2   Chloride 98 - 111 mmol/L 104  103  103   CO2 22 - 32 mmol/L 26  26  27    Calcium 8.9 - 10.3 mg/dL 9.2  9.4  9.2   Total Protein 6.5 - 8.1 g/dL 7.0  7.3  7.1   Total Bilirubin 0.0 - 1.2 mg/dL 0.9  0.8  0.6   Alkaline Phos 38 - 126 U/L 65  70  71   AST 15 - 41 U/L 19  20  22    ALT 0 - 44 U/L 16  18  18

## 2023-11-20 ENCOUNTER — Encounter: Payer: Self-pay | Admitting: Internal Medicine

## 2024-01-18 ENCOUNTER — Ambulatory Visit (INDEPENDENT_AMBULATORY_CARE_PROVIDER_SITE_OTHER): Payer: Medicare Other | Admitting: Internal Medicine

## 2024-01-18 ENCOUNTER — Encounter: Payer: Self-pay | Admitting: Internal Medicine

## 2024-01-18 VITALS — BP 130/78 | HR 95 | Temp 97.8°F | Ht 62.0 in | Wt 125.6 lb

## 2024-01-18 DIAGNOSIS — G250 Essential tremor: Secondary | ICD-10-CM

## 2024-01-18 DIAGNOSIS — I119 Hypertensive heart disease without heart failure: Secondary | ICD-10-CM | POA: Diagnosis not present

## 2024-01-18 DIAGNOSIS — I2510 Atherosclerotic heart disease of native coronary artery without angina pectoris: Secondary | ICD-10-CM | POA: Diagnosis not present

## 2024-01-18 DIAGNOSIS — C9110 Chronic lymphocytic leukemia of B-cell type not having achieved remission: Secondary | ICD-10-CM | POA: Diagnosis not present

## 2024-01-18 DIAGNOSIS — E7800 Pure hypercholesterolemia, unspecified: Secondary | ICD-10-CM | POA: Diagnosis not present

## 2024-01-18 DIAGNOSIS — W19XXXA Unspecified fall, initial encounter: Secondary | ICD-10-CM | POA: Diagnosis not present

## 2024-01-18 DIAGNOSIS — Z0000 Encounter for general adult medical examination without abnormal findings: Secondary | ICD-10-CM | POA: Diagnosis not present

## 2024-01-18 DIAGNOSIS — I2583 Coronary atherosclerosis due to lipid rich plaque: Secondary | ICD-10-CM

## 2024-01-18 DIAGNOSIS — E2839 Other primary ovarian failure: Secondary | ICD-10-CM | POA: Diagnosis not present

## 2024-01-18 LAB — POCT URINALYSIS DIPSTICK
Bilirubin, UA: NEGATIVE
Glucose, UA: NEGATIVE
Ketones, UA: NEGATIVE
Leukocytes, UA: NEGATIVE
Nitrite, UA: NEGATIVE
Protein, UA: NEGATIVE
Spec Grav, UA: 1.025 (ref 1.010–1.025)
Urobilinogen, UA: 0.2 U/dL
pH, UA: 6 (ref 5.0–8.0)

## 2024-01-18 NOTE — Assessment & Plan Note (Signed)

## 2024-01-18 NOTE — Assessment & Plan Note (Addendum)
 Chronic, she is s/p PCI in 2009.  She is without chest pain and shortness of breath.  She does not wish to take rx meds including B-blocker and statin therapy. She will take ASA 81mg  with MWF dosing. She was congratulated on her lifestyle changes and encouraged to stay motivated in following a heart healthy lifestyle.

## 2024-01-18 NOTE — Progress Notes (Signed)
 I,Victoria T Deloria Lair, CMA,acting as a Neurosurgeon for Gwynneth Aliment, MD.,have documented all relevant documentation on the behalf of Gwynneth Aliment, MD,as directed by  Gwynneth Aliment, MD while in the presence of Gwynneth Aliment, MD.  Subjective:    Patient ID: Carolyn Martin , female    DOB: 04/09/1951 , 73 y.o.   MRN: [id number]  Chief Complaint  Patient presents with   Annual Exam    She reports today for annual exam. She denies headaches, chest pain, and SOB.  She states she has since stopped all of her meds. She no longer feels she needs the medication. She has stopped cholesterol meds, she stopped BP meds many years ago. She is not currently established with a GYN. She has not completed mammogram.     Hypertension   Hyperlipidemia    HPI  Discussed the use of AI scribe software for clinical note transcription with the patient, who gave verbal consent to proceed.  History of Present Illness Carolyn Martin is a 73 year old female who presents for an annual physical exam.  She has a history of coronary artery disease and monitors her blood pressure at home, noting variability with readings ranging from 110/76 to 140/80. She is concerned about episodes of low blood pressure, with readings as low as 95/something. She attributes some stress to family dynamics, including interactions with her grandchildren and their parents.  Approximately three months ago, she experienced a fall at a skating rink, resulting in a sore wrist. She was not skating at the time but was attempting to sit down. She also recalls a previous fall at a skating rink while chasing her nephew, resulting in both of them falling, but she has not sustained any fractures from these incidents.  She is currently taking several supplements including vitamin B, vitamin C, CoQ10, magnesium glycinate, and a combination of vitamin D3 and K2. She has reduced her calcium intake from 1000 mg to 500 mg daily due to concerns about high  levels. She takes clonazepam as needed and has stopped taking aspirin three days a week due to mixed information about its benefits and risks.  She reports a good appetite, stating she is 'hungry right now' and has not eaten since 4 PM the previous day. No current fevers outside of a recent flu episode, which she believes was contracted from her grandchildren. Her bowel movements are regular, aided by magnesium supplementation, and she maintains a diet that includes vegetables. She describes her ideal day of rest as playing music, which she finds relaxing, and notes that her grandchildren, now twelve, are more independent.    Hypertension This is a chronic problem. The current episode started more than 1 year ago. The problem has been gradually improving since onset. The problem is controlled. Pertinent negatives include no blurred vision, chest pain, palpitations or shortness of breath. Risk factors for coronary artery disease include post-menopausal state and dyslipidemia. Past treatments include lifestyle changes. The current treatment provides moderate improvement. There are no compliance problems.  Hypertensive end-organ damage includes CAD/MI.  Hyperlipidemia This is a chronic problem. The problem is controlled. Pertinent negatives include no chest pain or shortness of breath. Risk factors for coronary artery disease include dyslipidemia and post-menopausal.     Past Medical History:  Diagnosis Date   Anginal pain (HCC) 05/14/2008   atypical chest pain-- Exercise  Myoview  EF 61% ,mod to severe ischemia Basal Anterior ,Anteroseptal,Mid Anterior,Mid Anteroseptal, Apical Anterior and Apical Septal regions  LV normal    Coronary artery disease 06/23/2010   Echo EF =>55%,LV normal   Hyperlipidemia    Hypertension    Sleep apnea    Tremor, essential 02/19/2016     Family History  Problem Relation Age of Onset   Diabetes Mother    Hypertension Mother    Kidney failure Mother    Transient  ischemic attack Father    Coronary artery disease Father    Dementia Father    Cancer Brother      Current Outpatient Medications:    Ascorbic Acid (VITAMIN C PO), Take 500 mg by mouth daily. , Disp: , Rfl:    B Complex Vitamins (B COMPLEX 100 PO), Take 100 mg by mouth daily., Disp: , Rfl:    Blood Pressure Monitoring (OMRON 5 SERIES BP MONITOR) DEVI, , Disp: , Rfl:    Cholecalciferol (VITAMIN D-3 PO), Take 5,000 Units by mouth daily. , Disp: , Rfl:    clonazePAM (KLONOPIN) 0.5 MG tablet, Take 1 tablet (0.5 mg total) by mouth daily as needed for anxiety., Disp: 30 tablet, Rfl: 1   Coenzyme Q10 (CO Q 10 PO), Take 100 mg by mouth daily. , Disp: , Rfl:    Allergies  Allergen Reactions   Iodine Hives   Sulfa Antibiotics       The patient states she uses post menopausal status for birth control. No LMP recorded. Patient is postmenopausal.. Negative for Dysmenorrhea. Negative for: breast discharge, breast lump(s), breast pain and breast self exam. Associated symptoms include abnormal vaginal bleeding. Pertinent negatives include abnormal bleeding (hematology), anxiety, decreased libido, depression, difficulty falling sleep, dyspareunia, history of infertility, nocturia, sexual dysfunction, sleep disturbances, urinary incontinence, urinary urgency, vaginal discharge and vaginal itching. Diet regular.The patient states her exercise level is  moderate - she lives an active lifestyle.   . The patient's tobacco use is:  Social History   Tobacco Use  Smoking Status Former   Current packs/day: 0.00   Average packs/day: 1 pack/day for 26.7 years (26.7 ttl pk-yrs)   Types: Cigarettes   Start date: 04/06/1970   Quit date: 12/16/1996   Years since quitting: 27.1  Smokeless Tobacco Never  Tobacco Comments   1ppd x 7 years,   . She has been exposed to passive smoke. The patient's alcohol use is:  Social History   Substance and Sexual Activity  Alcohol Use No    Review of Systems   Constitutional: Negative.   HENT: Negative.    Eyes: Negative.  Negative for blurred vision.  Respiratory: Negative.  Negative for shortness of breath.   Cardiovascular: Negative.  Negative for chest pain and palpitations.  Gastrointestinal: Negative.   Endocrine: Negative.   Genitourinary: Negative.   Musculoskeletal: Negative.        States she fell about 3 months ago while at skating rink. She initially had right wrist pain, this has since resolved.   Skin: Negative.   Allergic/Immunologic: Negative.   Neurological: Negative.   Hematological: Negative.   Psychiatric/Behavioral: Negative.       Today's Vitals   01/18/24 0937  BP: 130/78  Pulse: 95  Temp: 97.8 F (36.6 C)  SpO2: 98%  Weight: 125 lb 9.6 oz (57 kg)  Height: 5\' 2"  (1.575 m)   Body mass index is 22.97 kg/m.  Wt Readings from Last 3 Encounters:  01/18/24 125 lb 9.6 oz (57 kg)  11/17/23 125 lb 8 oz (56.9 kg)  07/17/23 127 lb (57.6 kg)    BP Readings  from Last 3 Encounters:  01/18/24 130/78  11/17/23 121/63  07/17/23 124/70     Objective:  Physical Exam Vitals and nursing note reviewed.  Constitutional:      Appearance: Normal appearance.  HENT:     Head: Normocephalic and atraumatic.     Right Ear: Tympanic membrane, ear canal and external ear normal.     Left Ear: Tympanic membrane, ear canal and external ear normal.     Nose: No rhinorrhea.     Mouth/Throat:     Pharynx: No oropharyngeal exudate or posterior oropharyngeal erythema.  Eyes:     Extraocular Movements: Extraocular movements intact.     Conjunctiva/sclera: Conjunctivae normal.     Pupils: Pupils are equal, round, and reactive to light.  Cardiovascular:     Rate and Rhythm: Regular rhythm.     Pulses: Normal pulses.          Dorsalis pedis pulses are 2+ on the right side and 2+ on the left side.     Heart sounds: Normal heart sounds.  Pulmonary:     Effort: Pulmonary effort is normal.     Breath sounds: Normal breath sounds.   Chest:  Breasts:    Tanner Score is 5.     Right: Normal.     Left: Normal.  Abdominal:     General: Abdomen is flat. Bowel sounds are normal.     Palpations: Abdomen is soft.  Genitourinary:    Comments: deferred Musculoskeletal:        General: Normal range of motion.     Cervical back: Normal range of motion and neck supple.  Skin:    General: Skin is warm and dry.  Neurological:     General: No focal deficit present.     Mental Status: She is alert and oriented to person, place, and time.  Psychiatric:        Mood and Affect: Mood normal.        Behavior: Behavior normal.         Assessment And Plan:     Encounter for annual health examination Assessment & Plan: A full exam was performed.  Importance of monthly self breast exams was discussed with the patient.  She is advised to get 30-45 minutes of regular exercise, no less than four to five days per week. Both weight-bearing and aerobic exercises are recommended.  She is advised to follow a healthy diet with at least six fruits/veggies per day, decrease intake of red meat and other saturated fats and to increase fish intake to twice weekly.  Meats/fish should not be fried -- baked, boiled or broiled is preferable. It is also important to cut back on your sugar intake.  Be sure to read labels - try to avoid anything with added sugar, high fructose corn syrup or other sweeteners.  If you must use a sweetener, you can try stevia or monkfruit.  It is also important to avoid artificially sweetened foods/beverages and diet drinks. Lastly, wear SPF 50 sunscreen on exposed skin and when in direct sunlight for an extended period of time.  Be sure to avoid fast food restaurants and aim for at least 60 ounces of water daily.       Hypertensive heart disease without heart failure Assessment & Plan: Chronic, fair control.  She is no longer on meds per her request. EKG performed, NSR w/o acute changes.  However, she does report  occasional readings in 140s. She does not wish to take meds.  Encouraged to follow low sodium diet. She will f/u in six months.  - Consider antihypertensive medication if blood pressure consistently exceeds 130/80.  Orders: -     POCT urinalysis dipstick -     Microalbumin / creatinine urine ratio -     CMP14+EGFR -     Lipid panel -     EKG 12-Lead  Coronary artery disease due to lipid rich plaque Assessment & Plan: Chronic, she is s/p PCI in 2009.  She is without chest pain and shortness of breath.  She does not wish to take rx meds including B-blocker and statin therapy. She will take ASA 81mg  with MWF dosing. She was congratulated on her lifestyle changes and encouraged to stay motivated in following a heart healthy lifestyle.    Pure hypercholesterolemia Assessment & Plan: Chronic, will check lipid panel today. She declines statin therapy. She verbally understands the risk of an acute cardiac event. She no longer feels she "needs" the medication.   Orders: -     CMP14+EGFR -     Lipid panel  CLL (chronic lymphocytic leukemia) (HCC) Assessment & Plan: Chronic, Hematology evaluation/input appreciated. Most recent notes reviewed. She is currently under observation.    Benign head tremor Assessment & Plan: Chronic, stable. She has had Neurology evaluations in the past. She uses clonazepam prn.    Fall, initial encounter Assessment & Plan: She states she had fall at skating rink recently. She fell onto her wrist, states it was sore for awhile, but did not break it. Therefore, she feels she has strong bones. She is encouraged to take caution when in skating rink.    Estrogen deficiency -     DG Bone Density; Future  General Health Maintenance Taking multiple supplements with good appetite and regular bowel movements. - Encourage a balanced diet with adequate vegetables. - Discuss potential benefits of regular aspirin use for cardiovascular health.   Return for 1 year HM,  6 month chol & bpc. Patient was given opportunity to ask questions. Patient verbalized understanding of the plan and was able to repeat key elements of the plan. All questions were answered to their satisfaction.    I, Gwynneth Aliment, MD, have reviewed all documentation for this visit. The documentation on 01/18/24 for the exam, diagnosis, procedures, and orders are all accurate and complete.

## 2024-01-18 NOTE — Patient Instructions (Signed)

## 2024-01-19 LAB — LIPID PANEL
Chol/HDL Ratio: 4 ratio (ref 0.0–4.4)
Cholesterol, Total: 265 mg/dL — ABNORMAL HIGH (ref 100–199)
HDL: 67 mg/dL (ref >39)
LDL Chol Calc (NIH): 175 mg/dL — ABNORMAL HIGH (ref 0–99)
Triglycerides: 131 mg/dL (ref 0–149)
VLDL Cholesterol Cal: 23 mg/dL (ref 5–40)

## 2024-01-19 LAB — CMP14+EGFR
ALT: 15 IU/L (ref 0–32)
AST: 21 IU/L (ref 0–40)
Albumin: 4.7 g/dL (ref 3.8–4.8)
Alkaline Phosphatase: 95 IU/L (ref 44–121)
BUN/Creatinine Ratio: 20 (ref 12–28)
BUN: 18 mg/dL (ref 8–27)
Bilirubin Total: 0.6 mg/dL (ref 0.0–1.2)
CO2: 25 mmol/L (ref 20–29)
Calcium: 9.9 mg/dL (ref 8.7–10.3)
Chloride: 101 mmol/L (ref 96–106)
Creatinine, Ser: 0.9 mg/dL (ref 0.57–1.00)
Globulin, Total: 2 g/dL (ref 1.5–4.5)
Glucose: 78 mg/dL (ref 70–99)
Potassium: 4.5 mmol/L (ref 3.5–5.2)
Sodium: 140 mmol/L (ref 134–144)
Total Protein: 6.7 g/dL (ref 6.0–8.5)
eGFR: 68 mL/min/{1.73_m2} (ref >59)

## 2024-01-19 LAB — MICROALBUMIN / CREATININE URINE RATIO
Creatinine, Urine: 141.4 mg/dL
Microalb/Creat Ratio: 6 mg/g{creat} (ref 0–29)
Microalbumin, Urine: 8.1 ug/mL

## 2024-01-21 ENCOUNTER — Encounter: Payer: Self-pay | Admitting: Internal Medicine

## 2024-01-21 DIAGNOSIS — W19XXXA Unspecified fall, initial encounter: Secondary | ICD-10-CM | POA: Insufficient documentation

## 2024-01-21 NOTE — Progress Notes (Incomplete)
 I,Victoria T Deloria Lair, CMA,acting as a Neurosurgeon for Gwynneth Aliment, MD.,have documented all relevant documentation on the behalf of Gwynneth Aliment, MD,as directed by  Gwynneth Aliment, MD while in the presence of Gwynneth Aliment, MD.  Subjective:    Patient ID: Carolyn Martin , female    DOB: 02-15-1951 , 73 y.o.   MRN: [id number]  Chief Complaint  Patient presents with  . Annual Exam    She reports today for annual exam. She denies headaches, chest pain, and SOB.  She states she has since stopped all of her meds. She no longer feels she needs the medication. She has stopped cholesterol meds, she stopped BP meds many years ago. She is not currently established with a GYN. She has not completed mammogram.    . Hypertension  . Hyperlipidemia    HPI  Discussed the use of AI scribe software for clinical note transcription with the patient, who gave verbal consent to proceed.  History of Present Illness Carolyn Martin is a 73 year old female who presents for an annual physical exam.  She has a history of coronary artery disease and monitors her blood pressure at home, noting variability with readings ranging from 110/76 to 140/80. She is concerned about episodes of low blood pressure, with readings as low as 95/something. She attributes some stress to family dynamics, including interactions with her grandchildren and their parents.  Approximately three months ago, she experienced a fall at a skating rink, resulting in a sore wrist. She was not skating at the time but was attempting to sit down. She also recalls a previous fall at a skating rink while chasing her nephew, resulting in both of them falling, but she has not sustained any fractures from these incidents.  She is currently taking several supplements including vitamin B, vitamin C, CoQ10, magnesium glycinate, and a combination of vitamin D3 and K2. She has reduced her calcium intake from 1000 mg to 500 mg daily due to concerns about  high levels. She takes clonazepam as needed and has stopped taking aspirin three days a week due to mixed information about its benefits and risks.  She reports a good appetite, stating she is 'hungry right now' and has not eaten since 4 PM the previous day. No current fevers outside of a recent flu episode, which she believes was contracted from her grandchildren. Her bowel movements are regular, aided by magnesium supplementation, and she maintains a diet that includes vegetables. She describes her ideal day of rest as playing music, which she finds relaxing, and notes that her grandchildren, now twelve, are more independent.    Hypertension This is a chronic problem. The current episode started more than 1 year ago. The problem has been gradually improving since onset. The problem is controlled. Pertinent negatives include no blurred vision, chest pain, palpitations or shortness of breath. Risk factors for coronary artery disease include post-menopausal state and dyslipidemia. Past treatments include lifestyle changes. The current treatment provides moderate improvement. There are no compliance problems.  Hypertensive end-organ damage includes CAD/MI.  Hyperlipidemia This is a chronic problem. The problem is controlled. Pertinent negatives include no chest pain or shortness of breath. Risk factors for coronary artery disease include dyslipidemia and post-menopausal.     Past Medical History:  Diagnosis Date  . Anginal pain (HCC) 05/14/2008   atypical chest pain-- Exercise  Myoview  EF 61% ,mod to severe ischemia Basal Anterior ,Anteroseptal,Mid Anterior,Mid Anteroseptal, Apical Anterior and Apical Septal regions  LV normal   . Coronary artery disease 06/23/2010   Echo EF =>55%,LV normal  . Hyperlipidemia   . Hypertension   . Sleep apnea   . Tremor, essential 02/19/2016     Family History  Problem Relation Age of Onset  . Diabetes Mother   . Hypertension Mother   . Kidney failure Mother    . Transient ischemic attack Father   . Coronary artery disease Father   . Dementia Father   . Cancer Brother      Current Outpatient Medications:  .  Ascorbic Acid (VITAMIN C PO), Take 500 mg by mouth daily. , Disp: , Rfl:  .  B Complex Vitamins (B COMPLEX 100 PO), Take 100 mg by mouth daily., Disp: , Rfl:  .  Blood Pressure Monitoring (OMRON 5 SERIES BP MONITOR) DEVI, , Disp: , Rfl:  .  Cholecalciferol (VITAMIN D-3 PO), Take 5,000 Units by mouth daily. , Disp: , Rfl:  .  clonazePAM (KLONOPIN) 0.5 MG tablet, Take 1 tablet (0.5 mg total) by mouth daily as needed for anxiety., Disp: 30 tablet, Rfl: 1 .  Coenzyme Q10 (CO Q 10 PO), Take 100 mg by mouth daily. , Disp: , Rfl:    Allergies  Allergen Reactions  . Iodine Hives  . Sulfa Antibiotics       The patient states she uses post menopausal status for birth control. No LMP recorded. Patient is postmenopausal.. Negative for Dysmenorrhea. Negative for: breast discharge, breast lump(s), breast pain and breast self exam. Associated symptoms include abnormal vaginal bleeding. Pertinent negatives include abnormal bleeding (hematology), anxiety, decreased libido, depression, difficulty falling sleep, dyspareunia, history of infertility, nocturia, sexual dysfunction, sleep disturbances, urinary incontinence, urinary urgency, vaginal discharge and vaginal itching. Diet regular.The patient states her exercise level is  moderate - she lives an active lifestyle.   . The patient's tobacco use is:  Social History   Tobacco Use  Smoking Status Former  . Current packs/day: 0.00  . Average packs/day: 1 pack/day for 26.7 years (26.7 ttl pk-yrs)  . Types: Cigarettes  . Start date: 04/06/1970  . Quit date: 12/16/1996  . Years since quitting: 27.1  Smokeless Tobacco Never  Tobacco Comments   1ppd x 7 years,   . She has been exposed to passive smoke. The patient's alcohol use is:  Social History   Substance and Sexual Activity  Alcohol Use No     Review of Systems  Constitutional: Negative.   HENT: Negative.    Eyes: Negative.  Negative for blurred vision.  Respiratory: Negative.  Negative for shortness of breath.   Cardiovascular: Negative.  Negative for chest pain and palpitations.  Gastrointestinal: Negative.   Endocrine: Negative.   Genitourinary: Negative.   Musculoskeletal: Negative.        States she fell about 3 months ago while at skating rink. She initially had right wrist pain, this has since resolved.   Skin: Negative.   Allergic/Immunologic: Negative.   Neurological: Negative.   Hematological: Negative.   Psychiatric/Behavioral: Negative.       Today's Vitals   01/18/24 0937  BP: 130/78  Pulse: 95  Temp: 97.8 F (36.6 C)  SpO2: 98%  Weight: 125 lb 9.6 oz (57 kg)  Height: 5\' 2"  (1.575 m)   Body mass index is 22.97 kg/m.  Wt Readings from Last 3 Encounters:  01/18/24 125 lb 9.6 oz (57 kg)  11/17/23 125 lb 8 oz (56.9 kg)  07/17/23 127 lb (57.6 kg)    BP Readings  from Last 3 Encounters:  01/18/24 130/78  11/17/23 121/63  07/17/23 124/70     Objective:  Physical Exam Vitals and nursing note reviewed.  Constitutional:      Appearance: Normal appearance.  HENT:     Head: Normocephalic and atraumatic.     Right Ear: Tympanic membrane, ear canal and external ear normal.     Left Ear: Tympanic membrane, ear canal and external ear normal.     Nose: No rhinorrhea.     Mouth/Throat:     Pharynx: No oropharyngeal exudate or posterior oropharyngeal erythema.  Eyes:     Extraocular Movements: Extraocular movements intact.     Conjunctiva/sclera: Conjunctivae normal.     Pupils: Pupils are equal, round, and reactive to light.  Cardiovascular:     Rate and Rhythm: Regular rhythm.     Pulses: Normal pulses.          Dorsalis pedis pulses are 2+ on the right side and 2+ on the left side.     Heart sounds: Normal heart sounds.  Pulmonary:     Effort: Pulmonary effort is normal.     Breath sounds:  Normal breath sounds.  Chest:  Breasts:    Tanner Score is 5.     Right: Normal.     Left: Normal.  Abdominal:     General: Abdomen is flat. Bowel sounds are normal.     Palpations: Abdomen is soft.  Genitourinary:    Comments: deferred Musculoskeletal:        General: Normal range of motion.     Cervical back: Normal range of motion and neck supple.  Skin:    General: Skin is warm and dry.  Neurological:     General: No focal deficit present.     Mental Status: She is alert and oriented to person, place, and time.  Psychiatric:        Mood and Affect: Mood normal.        Behavior: Behavior normal.         Assessment And Plan:     Encounter for annual health examination Assessment & Plan: A full exam was performed.  Importance of monthly self breast exams was discussed with the patient.  She is advised to get 30-45 minutes of regular exercise, no less than four to five days per week. Both weight-bearing and aerobic exercises are recommended.  She is advised to follow a healthy diet with at least six fruits/veggies per day, decrease intake of red meat and other saturated fats and to increase fish intake to twice weekly.  Meats/fish should not be fried -- baked, boiled or broiled is preferable. It is also important to cut back on your sugar intake.  Be sure to read labels - try to avoid anything with added sugar, high fructose corn syrup or other sweeteners.  If you must use a sweetener, you can try stevia or monkfruit.  It is also important to avoid artificially sweetened foods/beverages and diet drinks. Lastly, wear SPF 50 sunscreen on exposed skin and when in direct sunlight for an extended period of time.  Be sure to avoid fast food restaurants and aim for at least 60 ounces of water daily.       Hypertensive heart disease without heart failure -     POCT urinalysis dipstick -     Microalbumin / creatinine urine ratio -     CMP14+EGFR -     Lipid panel -     EKG  12-Lead  Pure hypercholesterolemia -     CMP14+EGFR -     Lipid panel  Coronary artery disease due to lipid rich plaque Assessment & Plan: Chronic, she is s/p PCI in 2009.  She is without chest pain and shortness of breath.  She does not wish to take rx meds including B-blocker and statin therapy. She will take ASA 81mg  with MWF dosing.    Benign head tremor Assessment & Plan: Chronic, stable. She has had Neurology evaluations in the past.    Estrogen deficiency -     DG Bone Density; Future  Assessment and Plan Assessment & Plan Coronary Artery Disease Emphasized aspirin's role in secondary prevention of cardiovascular events due to her coronary artery disease history. - Consider resuming aspirin three days a week for secondary prevention.  Hypertension Blood pressure variable; concern about hypotension with medication. Advised stress management and home monitoring. - Monitor blood pressure at home more frequently. - Consider antihypertensive medication if blood pressure consistently exceeds 130/80.  Leukocytosis Managed by hematologist with symptom improvement and lab work showing progress. - Continue follow-up with hematologist every six months.  Osteoporosis Screening Recommended screening due to age and risk factors despite no fractures from recent fall. - Recommend bone density test at [facility].  General Health Maintenance Taking multiple supplements with good appetite and regular bowel movements. - Encourage a balanced diet with adequate vegetables. - Discuss potential benefits of regular aspirin use for cardiovascular health.     Return for 1 year HM, 6 month chol & bpc. Patient was given opportunity to ask questions. Patient verbalized understanding of the plan and was able to repeat key elements of the plan. All questions were answered to their satisfaction.    I, Gwynneth Aliment, MD, have reviewed all documentation for this visit. The  documentation on 01/18/24 for the exam, diagnosis, procedures, and orders are all accurate and complete.

## 2024-01-21 NOTE — Assessment & Plan Note (Signed)
 She states she had fall at skating rink recently. She fell onto her wrist, states it was sore for awhile, but did not break it. Therefore, she feels she has strong bones. She is encouraged to take caution when in skating rink.

## 2024-01-21 NOTE — Assessment & Plan Note (Signed)
 Chronic, stable. She has had Neurology evaluations in the past. She uses clonazepam prn.

## 2024-01-21 NOTE — Assessment & Plan Note (Signed)
 Chronic, Hematology evaluation/input appreciated. Most recent notes reviewed. She is currently under observation.

## 2024-01-21 NOTE — Assessment & Plan Note (Addendum)
 Chronic, fair control.  She is no longer on meds per her request. EKG performed, NSR w/o acute changes.  However, she does report occasional readings in 140s. She does not wish to take meds. Encouraged to follow low sodium diet. She will f/u in six months.  - Consider antihypertensive medication if blood pressure consistently exceeds 130/80.

## 2024-01-21 NOTE — Assessment & Plan Note (Signed)
Chronic, will check lipid panel today. She declines statin therapy. She verbally understands the risk of an acute cardiac event. She no longer feels she "needs" the medication.

## 2024-05-17 ENCOUNTER — Other Ambulatory Visit: Payer: Medicare Other

## 2024-05-17 ENCOUNTER — Ambulatory Visit: Payer: Medicare Other | Admitting: Oncology

## 2024-06-25 ENCOUNTER — Other Ambulatory Visit: Payer: Self-pay | Admitting: Internal Medicine

## 2024-06-25 DIAGNOSIS — G250 Essential tremor: Secondary | ICD-10-CM

## 2024-06-27 DIAGNOSIS — K602 Anal fissure, unspecified: Secondary | ICD-10-CM | POA: Diagnosis not present

## 2024-06-27 DIAGNOSIS — Z1211 Encounter for screening for malignant neoplasm of colon: Secondary | ICD-10-CM | POA: Diagnosis not present

## 2024-06-27 DIAGNOSIS — Z860100 Personal history of colon polyps, unspecified: Secondary | ICD-10-CM | POA: Diagnosis not present

## 2024-06-27 DIAGNOSIS — K625 Hemorrhage of anus and rectum: Secondary | ICD-10-CM | POA: Diagnosis not present

## 2024-06-27 DIAGNOSIS — Z800 Family history of malignant neoplasm of digestive organs: Secondary | ICD-10-CM | POA: Diagnosis not present

## 2024-07-04 ENCOUNTER — Other Ambulatory Visit: Payer: Self-pay | Admitting: Internal Medicine

## 2024-07-04 DIAGNOSIS — G250 Essential tremor: Secondary | ICD-10-CM

## 2024-07-04 MED ORDER — CLONAZEPAM 0.5 MG PO TABS: 0.5000 mg | ORAL_TABLET | Freq: Every day | ORAL | 0 refills | Status: AC | PRN

## 2024-07-24 ENCOUNTER — Ambulatory Visit (INDEPENDENT_AMBULATORY_CARE_PROVIDER_SITE_OTHER): Admitting: Internal Medicine

## 2024-07-24 ENCOUNTER — Encounter: Payer: Self-pay | Admitting: Internal Medicine

## 2024-07-24 VITALS — BP 124/78 | HR 83 | Temp 97.9°F | Ht 62.0 in | Wt 129.6 lb

## 2024-07-24 DIAGNOSIS — I119 Hypertensive heart disease without heart failure: Secondary | ICD-10-CM

## 2024-07-24 DIAGNOSIS — C9110 Chronic lymphocytic leukemia of B-cell type not having achieved remission: Secondary | ICD-10-CM | POA: Diagnosis not present

## 2024-07-24 DIAGNOSIS — G250 Essential tremor: Secondary | ICD-10-CM

## 2024-07-24 DIAGNOSIS — E7800 Pure hypercholesterolemia, unspecified: Secondary | ICD-10-CM

## 2024-07-24 DIAGNOSIS — K909 Intestinal malabsorption, unspecified: Secondary | ICD-10-CM

## 2024-07-24 DIAGNOSIS — E2839 Other primary ovarian failure: Secondary | ICD-10-CM

## 2024-07-24 DIAGNOSIS — I2583 Coronary atherosclerosis due to lipid rich plaque: Secondary | ICD-10-CM

## 2024-07-24 DIAGNOSIS — I2510 Atherosclerotic heart disease of native coronary artery without angina pectoris: Secondary | ICD-10-CM

## 2024-07-24 NOTE — Assessment & Plan Note (Addendum)
 Chronic, well controlled.  She is no longer on meds per her request. She does not wish to take meds. Encouraged to follow low sodium diet. She will f/u in six months.  - Consider antihypertensive medication if blood pressure consistently exceeds 130/80.

## 2024-07-24 NOTE — Patient Instructions (Signed)
 Hypertension, Adult Hypertension is another name for high blood pressure. High blood pressure forces your heart to work harder to pump blood. This can cause problems over time. There are two numbers in a blood pressure reading. There is a top number (systolic) over a bottom number (diastolic). It is best to have a blood pressure that is below 120/80. What are the causes? The cause of this condition is not known. Some other conditions can lead to high blood pressure. What increases the risk? Some lifestyle factors can make you more likely to develop high blood pressure: Smoking. Not getting enough exercise or physical activity. Being overweight. Having too much fat, sugar, calories, or salt (sodium) in your diet. Drinking too much alcohol. Other risk factors include: Having any of these conditions: Heart disease. Diabetes. High cholesterol. Kidney disease. Obstructive sleep apnea. Having a family history of high blood pressure and high cholesterol. Age. The risk increases with age. Stress. What are the signs or symptoms? High blood pressure may not cause symptoms. Very high blood pressure (hypertensive crisis) may cause: Headache. Fast or uneven heartbeats (palpitations). Shortness of breath. Nosebleed. Vomiting or feeling like you may vomit (nauseous). Changes in how you see. Very bad chest pain. Feeling dizzy. Seizures. How is this treated? This condition is treated by making healthy lifestyle changes, such as: Eating healthy foods. Exercising more. Drinking less alcohol. Your doctor may prescribe medicine if lifestyle changes do not help enough and if: Your top number is above 130. Your bottom number is above 80. Your personal target blood pressure may vary. Follow these instructions at home: Eating and drinking  If told, follow the DASH eating plan. To follow this plan: Fill one half of your plate at each meal with fruits and vegetables. Fill one fourth of your plate  at each meal with whole grains. Whole grains include whole-wheat pasta, brown rice, and whole-grain bread. Eat or drink low-fat dairy products, such as skim milk or low-fat yogurt. Fill one fourth of your plate at each meal with low-fat (lean) proteins. Low-fat proteins include fish, chicken without skin, eggs, beans, and tofu. Avoid fatty meat, cured and processed meat, or chicken with skin. Avoid pre-made or processed food. Limit the amount of salt in your diet to less than 1,500 mg each day. Do not drink alcohol if: Your doctor tells you not to drink. You are pregnant, may be pregnant, or are planning to become pregnant. If you drink alcohol: Limit how much you have to: 0-1 drink a day for women. 0-2 drinks a day for men. Know how much alcohol is in your drink. In the [place] one drink equals one 12 oz bottle of beer (355 mL), one 5 oz glass of wine (148 mL), or one 1 oz glass of hard liquor (44 mL). Lifestyle  Work with your doctor to stay at a healthy weight or to lose weight. Ask your doctor what the best weight is for you. Get at least 30 minutes of exercise that causes your heart to beat faster (aerobic exercise) most days of the week. This may include walking, swimming, or biking. Get at least 30 minutes of exercise that strengthens your muscles (resistance exercise) at least 3 days a week. This may include lifting weights or doing Pilates. Do not smoke or use any products that contain nicotine or tobacco. If you need help quitting, ask your doctor. Check your blood pressure at home as told by your doctor. Keep all follow-up visits. Medicines Take over-the-counter and prescription medicines  only as told by your doctor. Follow directions carefully. Do not skip doses of blood pressure medicine. The medicine does not work as well if you skip doses. Skipping doses also puts you at risk for problems. Ask your doctor about side effects or reactions to medicines that you should watch  for. Contact a doctor if: You think you are having a reaction to the medicine you are taking. You have headaches that keep coming back. You feel dizzy. You have swelling in your ankles. You have trouble with your vision. Get help right away if: You get a very bad headache. You start to feel mixed up (confused). You feel weak or numb. You feel faint. You have very bad pain in your: Chest. Belly (abdomen). You vomit more than once. You have trouble breathing. These symptoms may be an emergency. Get help right away. Call 911. Do not wait to see if the symptoms will go away. Do not drive yourself to the hospital. Summary Hypertension is another name for high blood pressure. High blood pressure forces your heart to work harder to pump blood. For most people, a normal blood pressure is less than 120/80. Making healthy choices can help lower blood pressure. If your blood pressure does not get lower with healthy choices, you may need to take medicine. This information is not intended to replace advice given to you by your health care provider. Make sure you discuss any questions you have with your health care provider. Document Revised: 07/22/2021 Document Reviewed: 07/22/2021 Elsevier Patient Education  2024 [employer].

## 2024-07-24 NOTE — Assessment & Plan Note (Signed)
 Chronic, she is s/p PCI in 2009.  She is without chest pain and shortness of breath.  She does not wish to take rx meds including B-blocker and statin therapy. She will take ASA 81mg  with MWF dosing. She was congratulated on her lifestyle changes and encouraged to stay motivated in following a heart healthy lifestyle.

## 2024-07-24 NOTE — Progress Notes (Signed)
 I,Carolyn Martin, CMA,acting as a Neurosurgeon for Carolyn LOISE Slocumb, MD.,have documented all relevant documentation on the behalf of Carolyn LOISE Slocumb, MD,as directed by  Carolyn LOISE Slocumb, MD while in the presence of Carolyn LOISE Slocumb, MD.  Subjective:  Patient ID: Carolyn Martin , female    DOB: [birth year] , 73 y.o.   MRN: [id number]  Chief Complaint  Patient presents with   Hypertension    Pt is here for a follow up on a b/p check. She reports compliance with meds. She denies having any headaches, chest pain and shortness of breath.     Hyperlipidemia    HPI Discussed the use of AI scribe software for clinical note transcription with the patient, who gave verbal consent to proceed.  History of Present Illness Carolyn Martin is a 73 year old female with hypertension and hyperlipidemia who presents for a six-month follow-up visit.  She has stopped seeing her hematologist as her white blood cell count has remained stable. She has gained some weight recently due to increased eating.  She has a history of a rectal tear, initially thought to be hemorrhoids, which she attributes to constipation and hard stools. She experienced bleeding and is scheduled for a colonoscopy next Friday. She prefers not to be sedated for the procedure. Her brother had colon cancer, which influences her decision to proceed with the colonoscopy.  She takes clonazepam  as needed and various supplements, including vitamin C, B complex, CoQ10, and vitamin D  with vitamin K. She previously had high vitamin D  levels and has since reduced her intake. She is concerned about absorption issues, noting greasy, floating stools, suggesting possible malabsorption of fats. No diarrhea or abdominal pain. She does not consume alcohol.  She is physically active, walking daily and using a trampoline. She has declined a bone density test due to concerns about radiation exposure, despite a history of falls. She also opts out of mammograms.   Pt  is here for a follow up on a b/p check. She reports compliance with meds. She denies having any headaches, chest pain and shortness of breath.    Hypertension This is a chronic problem. The current episode started more than 1 year ago. The problem has been gradually improving since onset. The problem is controlled. Pertinent negatives include no blurred vision, chest pain, palpitations or shortness of breath. Risk factors for coronary artery disease include post-menopausal state and dyslipidemia. Past treatments include lifestyle changes. The current treatment provides moderate improvement. There are no compliance problems.  Hypertensive end-organ damage includes CAD/MI.  Hyperlipidemia This is a chronic problem. The problem is controlled. Pertinent negatives include no chest pain or shortness of breath. Risk factors for coronary artery disease include dyslipidemia and post-menopausal.     Past Medical History:  Diagnosis Date   Anginal pain 05/14/2008   atypical chest pain-- Exercise  Myoview  EF 61% ,mod to severe ischemia Basal Anterior ,Anteroseptal,Mid Anterior,Mid Anteroseptal, Apical Anterior and Apical Septal regions   LV normal    Coronary artery disease 06/23/2010   Echo EF =>55%,LV normal   Hyperlipidemia    Hypertension    Sleep apnea    Tremor, essential 02/19/2016     Family History  Problem Relation Age of Onset   Diabetes Mother    Hypertension Mother    Kidney failure Mother    Transient ischemic attack Father    Coronary artery disease Father    Dementia Father    Cancer Brother  Current Outpatient Medications:    Ascorbic Acid (VITAMIN C PO), Take 500 mg by mouth daily. , Disp: , Rfl:    B Complex Vitamins (B COMPLEX 100 PO), Take 100 mg by mouth daily., Disp: , Rfl:    Blood Pressure Monitoring (OMRON 5 SERIES BP MONITOR) DEVI, , Disp: , Rfl:    Cholecalciferol (VITAMIN D -3 PO), Take 5,000 Units by mouth daily. , Disp: , Rfl:    clonazePAM  (KLONOPIN ) 0.5 MG  tablet, Take 1 tablet (0.5 mg total) by mouth daily as needed for anxiety., Disp: 30 tablet, Rfl: 0   Coenzyme Q10 (CO Q 10 PO), Take 100 mg by mouth daily. , Disp: , Rfl:    Allergies  Allergen Reactions   Iodine Hives   Sulfa Antibiotics      Review of Systems  Constitutional: Negative.   Eyes:  Negative for blurred vision.  Respiratory: Negative.  Negative for shortness of breath.   Cardiovascular: Negative.  Negative for chest pain and palpitations.  Neurological: Negative.   Psychiatric/Behavioral: Negative.       Today's Vitals   07/24/24 0945  BP: 124/78  Pulse: 83  Temp: 97.9 F (36.6 C)  SpO2: 98%  Weight: 129 lb 9.6 oz (58.8 kg)  Height: 5' 2 (1.575 m)   Body mass index is 23.7 kg/m.  Wt Readings from Last 3 Encounters:  07/24/24 129 lb 9.6 oz (58.8 kg)  01/18/24 125 lb 9.6 oz (57 kg)  11/17/23 125 lb 8 oz (56.9 kg)     Objective:  Physical Exam Vitals and nursing note reviewed.  Constitutional:      Appearance: Normal appearance.  HENT:     Head: Normocephalic and atraumatic.  Cardiovascular:     Rate and Rhythm: Normal rate and regular rhythm.     Heart sounds: Normal heart sounds.  Pulmonary:     Effort: Pulmonary effort is normal.     Breath sounds: Normal breath sounds.  Musculoskeletal:     Cervical back: Normal range of motion.  Skin:    General: Skin is warm.  Neurological:     General: No focal deficit present.     Mental Status: She is alert.  Psychiatric:        Mood and Affect: Mood normal.        Behavior: Behavior normal.         Assessment And Plan:  Hypertensive heart disease without heart failure Assessment & Plan: Chronic, well controlled.  She is no longer on meds per her request. She does not wish to take meds. Encouraged to follow low sodium diet. She will f/u in six months.  - Consider antihypertensive medication if blood pressure consistently exceeds 130/80.  Orders: -     CMP14+EGFR -     Lipid panel  Coronary  artery disease due to lipid rich plaque Assessment & Plan: Chronic, she is s/p PCI in 2009.  She is without chest pain and shortness of breath.  She does not wish to take rx meds including B-blocker and statin therapy. She will take ASA 81mg  with MWF dosing. She was congratulated on her lifestyle changes and encouraged to stay motivated in following a heart healthy lifestyle.   Orders: -     Lipid panel  Pure hypercholesterolemia Assessment & Plan: Chronic, will check lipid panel today. She declines statin therapy. She verbally understands the risk of an acute cardiac event. She no longer feels she needs the medication.   Orders: -  CMP14+EGFR -     Lipid panel -     TSH  CLL (chronic lymphocytic leukemia) (HCC) Assessment & Plan: Chronic, Hematology evaluation/input appreciated. Most recent notes reviewed. She is no longer under direct care of Hematology.  - Check labs as below. She agrees to return to Hem/Onc if she starts to experience night sweats, weight loss, fevers, etc.   Orders: -     CBC with Differential/Platelet  Estrogen deficiency Assessment & Plan: She declines getting bone density at this time. I will check vitamin D  level today. She reports engaging in weight-bearing exercise on a regular basis.    Benign head tremor Assessment & Plan: Chronic, stable. She has had Neurology evaluations in the past. She uses clonazepam  prn.    Intestinal malabsorption, unspecified type Assessment & Plan: She reports having floating/greasy stools. Reports greasy, floating stools suggesting possible fat malabsorption. Differential includes malabsorption or pancreatitis, though pancreatitis is less likely due to lack of abdominal pain and no alcohol use. - Discuss stool characteristics with gastroenterologist during colonoscopy appointment.  Orders: -     VITAMIN D  25 Hydroxy (Vit-D Deficiency, Fractures)    Return for 6 MONTH BP & CHOL F/U.SABRA  Patient was given  opportunity to ask questions. Patient verbalized understanding of the plan and was able to repeat key elements of the plan. All questions were answered to their satisfaction.   I, Carolyn LOISE Slocumb, MD, have reviewed all documentation for this visit. The documentation on 07/24/24 for the exam, diagnosis, procedures, and orders are all accurate and complete.   IF YOU HAVE BEEN REFERRED TO A SPECIALIST, IT MAY TAKE 1-2 WEEKS TO SCHEDULE/PROCESS THE REFERRAL. IF YOU HAVE NOT HEARD FROM US /SPECIALIST IN TWO WEEKS, PLEASE GIVE US  A CALL AT [phone].   THE PATIENT IS ENCOURAGED TO PRACTICE SOCIAL DISTANCING DUE TO THE COVID-19 PANDEMIC.

## 2024-07-24 NOTE — Assessment & Plan Note (Signed)
Chronic, will check lipid panel today. She declines statin therapy. She verbally understands the risk of an acute cardiac event. She no longer feels she "needs" the medication.

## 2024-07-24 NOTE — Assessment & Plan Note (Signed)
 Chronic, stable. She has had Neurology evaluations in the past. She uses clonazepam prn.

## 2024-07-24 NOTE — Assessment & Plan Note (Signed)
 Chronic, Hematology evaluation/input appreciated. Most recent notes reviewed. She is no longer under direct care of Hematology.  - Check labs as below. She agrees to return to Hem/Onc if she starts to experience night sweats, weight loss, fevers, etc.

## 2024-07-24 NOTE — Assessment & Plan Note (Signed)
 She reports having floating/greasy stools. Reports greasy, floating stools suggesting possible fat malabsorption. Differential includes malabsorption or pancreatitis, though pancreatitis is less likely due to lack of abdominal pain and no alcohol use. - Discuss stool characteristics with gastroenterologist during colonoscopy appointment.

## 2024-07-24 NOTE — Assessment & Plan Note (Signed)
 She declines getting bone density at this time. I will check vitamin D  level today. She reports engaging in weight-bearing exercise on a regular basis.

## 2024-07-25 ENCOUNTER — Ambulatory Visit: Payer: Self-pay | Admitting: Internal Medicine

## 2024-07-25 ENCOUNTER — Encounter: Payer: Self-pay | Admitting: Internal Medicine

## 2024-07-25 LAB — CMP14+EGFR
ALT: 14 IU/L (ref 0–32)
AST: 17 IU/L (ref 0–40)
Albumin: 4.6 g/dL (ref 3.8–4.8)
Alkaline Phosphatase: 100 IU/L (ref 49–135)
BUN/Creatinine Ratio: 21 (ref 12–28)
BUN: 20 mg/dL (ref 8–27)
Bilirubin Total: 0.6 mg/dL (ref 0.0–1.2)
CO2: 23 mmol/L (ref 20–29)
Calcium: 9.8 mg/dL (ref 8.7–10.3)
Chloride: 101 mmol/L (ref 96–106)
Creatinine, Ser: 0.97 mg/dL (ref 0.57–1.00)
Globulin, Total: 2.2 g/dL (ref 1.5–4.5)
Glucose: 80 mg/dL (ref 70–99)
Potassium: 4.5 mmol/L (ref 3.5–5.2)
Sodium: 141 mmol/L (ref 134–144)
Total Protein: 6.8 g/dL (ref 6.0–8.5)
eGFR: 62 mL/min/1.73 (ref >59)

## 2024-07-25 LAB — CBC WITH DIFFERENTIAL/PLATELET
Basophils Absolute: 0.1 x10E3/uL (ref 0.0–0.2)
Basos: 1 %
EOS (ABSOLUTE): 0.1 x10E3/uL (ref 0.0–0.4)
Eos: 1 %
Hematocrit: 42.3 % (ref 34.0–46.6)
Hemoglobin: 13.5 g/dL (ref 11.1–15.9)
Immature Grans (Abs): 0 x10E3/uL (ref 0.0–0.1)
Immature Granulocytes: 0 %
Lymphocytes Absolute: 12.1 x10E3/uL — ABNORMAL HIGH (ref 0.7–3.1)
Lymphs: 74 %
MCH: 31.4 pg (ref 26.6–33.0)
MCHC: 31.9 g/dL (ref 31.5–35.7)
MCV: 98 fL — ABNORMAL HIGH (ref 79–97)
Monocytes Absolute: 0.6 x10E3/uL (ref 0.1–0.9)
Monocytes: 4 %
Neutrophils Absolute: 3.3 x10E3/uL (ref 1.4–7.0)
Neutrophils: 20 %
Platelets: 243 x10E3/uL (ref 150–450)
RBC: 4.3 x10E6/uL (ref 3.77–5.28)
RDW: 12.3 % (ref 11.7–15.4)
WBC: 16.2 x10E3/uL — ABNORMAL HIGH (ref 3.4–10.8)

## 2024-07-25 LAB — LIPID PANEL
Chol/HDL Ratio: 4.4 ratio (ref 0.0–4.4)
Cholesterol, Total: 290 mg/dL — ABNORMAL HIGH (ref 100–199)
HDL: 66 mg/dL (ref >39)
LDL Chol Calc (NIH): 196 mg/dL — ABNORMAL HIGH (ref 0–99)
Triglycerides: 152 mg/dL — ABNORMAL HIGH (ref 0–149)
VLDL Cholesterol Cal: 28 mg/dL (ref 5–40)

## 2024-07-25 LAB — TSH: TSH: 0.824 u[IU]/mL (ref 0.450–4.500)

## 2024-07-25 LAB — VITAMIN D 25 HYDROXY (VIT D DEFICIENCY, FRACTURES): Vit D, 25-Hydroxy: 57.9 ng/mL (ref 30.0–100.0)

## 2024-08-29 ENCOUNTER — Ambulatory Visit: Payer: Self-pay | Admitting: *Deleted

## 2024-08-29 NOTE — Telephone Encounter (Signed)
 FYI Only or Action Required?: FYI only for provider: appointment scheduled on 08/30/24.  Patient was last seen in primary care on 07/24/2024 by Jarold Medici, MD.  Called Nurse Triage reporting Nasal Congestion.  Symptoms began several weeks ago.  Interventions attempted: OTC medications: mucinex , sudafed.  Symptoms are: gradually worsening.  Triage Disposition: See PCP When Office is Open (Within 3 Days)  Patient/caregiver understands and will follow disposition?: Yes         Copied from CRM #[id number]. Topic: Clinical - Red Word Triage >> Aug 29, 2024 12:38 PM Charlet HERO wrote: Red Word that prompted transfer to Nurse Triage: Patient is calling about on going congestion and she has green mucus she has had for about 3 weeks. Jarold Reason for Disposition  [1] Sinus congestion (pressure, fullness) AND [2] present > 10 days  Answer Assessment - Initial Assessment Questions Appt scheduled tomorrow with other provider. None available with PCP . Patient requesting appt before weekend.       1. LOCATION: Where does it hurt?      Cheeks and forehead 2. ONSET: When did the sinus pain start?  (e.g., hours, days)      3 weeks ago  3. SEVERITY: How bad is the pain?   (Scale 0-10; or none, mild, moderate or severe)     4/10  4. RECURRENT SYMPTOM: Have you ever had sinus problems before? If Yes, ask: When was the last time? and What happened that time?      Na  5. NASAL CONGESTION: Is the nose blocked? If Yes, ask: Can you open it or must you breathe through your mouth?     Blocked at times blowing nose for green mucus  6. NASAL DISCHARGE: Do you have discharge from your nose? If so ask, What color?     Yes green mucus  7. FEVER: Do you have a fever? If Yes, ask: What is it, how was it measured, and when did it start?      No  8. OTHER SYMPTOMS: Do you have any other symptoms? (e.g., sore throat, cough, earache, difficulty breathing)     Nasal congestion  , dry cough. Hoarseness at night . No chest pain no difficulty breathing.  9. PREGNANCY: Is there any chance you are pregnant? When was your last menstrual period?     na  Protocols used: Sinus Pain or Congestion-A-AH

## 2024-08-30 ENCOUNTER — Ambulatory Visit (INDEPENDENT_AMBULATORY_CARE_PROVIDER_SITE_OTHER): Payer: Self-pay | Admitting: Family Medicine

## 2024-08-30 ENCOUNTER — Encounter: Payer: Self-pay | Admitting: Family Medicine

## 2024-08-30 VITALS — BP 120/60 | HR 75 | Temp 98.1°F | Ht 62.0 in | Wt 131.0 lb

## 2024-08-30 DIAGNOSIS — C9110 Chronic lymphocytic leukemia of B-cell type not having achieved remission: Secondary | ICD-10-CM | POA: Diagnosis not present

## 2024-08-30 DIAGNOSIS — R0982 Postnasal drip: Secondary | ICD-10-CM | POA: Diagnosis not present

## 2024-08-30 DIAGNOSIS — J309 Allergic rhinitis, unspecified: Secondary | ICD-10-CM

## 2024-08-30 DIAGNOSIS — J3489 Other specified disorders of nose and nasal sinuses: Secondary | ICD-10-CM

## 2024-08-30 DIAGNOSIS — R0981 Nasal congestion: Secondary | ICD-10-CM

## 2024-08-30 LAB — POC COVID19/FLU A&B COMBO
Covid Antigen, POC: NEGATIVE
Influenza A Antigen, POC: NEGATIVE
Influenza B Antigen, POC: NEGATIVE

## 2024-08-30 MED ORDER — MOMETASONE FUROATE 50 MCG/ACT NA SUSP: 2.0000 | Freq: Two times a day (BID) | NASAL | 1 refills | Status: AC | PRN

## 2024-08-30 MED ORDER — CETIRIZINE HCL 10 MG PO TABS: 10.0000 mg | ORAL_TABLET | Freq: Every day | ORAL | 1 refills | Status: AC

## 2024-08-30 NOTE — Progress Notes (Signed)
 I,Jameka J Llittleton, CMA,acting as a neurosurgeon for [employer], NP.,have documented all relevant documentation on the behalf of Bruna Creighton, NP,as directed by  Bruna Creighton, NP while in the presence of Bruna Creighton, NP.  Subjective:  Patient ID: Carolyn Martin , female    DOB: [birth year] , 73 y.o.   MRN: [id number]  Chief Complaint  Patient presents with   URI    Patient presents today for cold symptoms. Patient reports her symptoms started 3 weeks ago. She has been having sinus pain and pressure and head congestion. Patient denies having a fever. She has tried mucinex , sudafed and tylenol  cold and flu. Patient reports the meds helped her a little bit but then it comes right back. Patient also reports having a little bit of ear pain on the right side    HPI Discussed the use of AI scribe software for clinical note transcription with the patient, who gave verbal consent to proceed.  History of Present Illness      Carolyn Martin is a 73 year old female with leukemia in remission who presents with three weeks of congestion and upper respiratory symptoms.  She has been experiencing congestion for the past three weeks. Initially, there was some improvement, but symptoms persisted as her family members also became ill. She describes clear nasal drainage, a sensation of choking on mucus, and a sore throat that resolved quickly. Nasal congestion is present without significant discharge when attempting to blow her nose, and she notes that her nasal passages feel swollen.  She has been using Mucinex  and Sudafed, which provide temporary relief. No fever, body aches, or headaches are present. She experiences some coughing. Symptoms worsen at night, requiring her to prop herself up to sleep. In the mornings, she observes greenish mucus, though it is mostly clear at other times. She has not taken allergy medications recently but has experienced jitteriness with some medications in the past.  Her past  medical history includes leukemia, which was in remission but recently showed an increase in white blood cell count. She has also been dealing with an anal fissure for about a year, initially thought to be hemorrhoids, and has not yet undergone a colonoscopy. She experiences occasional painful bumps, possibly related to her lymphatic system, which resolve on their own.           Past Medical History:  Diagnosis Date   Anginal pain 05/14/2008   atypical chest pain-- Exercise  Myoview  EF 61% ,mod to severe ischemia Basal Anterior ,Anteroseptal,Mid Anterior,Mid Anteroseptal, Apical Anterior and Apical Septal regions   LV normal    Coronary artery disease 06/23/2010   Echo EF =>55%,LV normal   Hyperlipidemia    Hypertension    Sleep apnea    Tremor, essential 02/19/2016     Family History  Problem Relation Age of Onset   Diabetes Mother    Hypertension Mother    Kidney failure Mother    Transient ischemic attack Father    Coronary artery disease Father    Dementia Father    Cancer Brother      Current Outpatient Medications:    Ascorbic Acid (VITAMIN C PO), Take 500 mg by mouth daily. , Disp: , Rfl:    B Complex Vitamins (B COMPLEX 100 PO), Take 100 mg by mouth daily., Disp: , Rfl:    cetirizine  (ZYRTEC  ALLERGY) 10 MG tablet, Take 1 tablet (10 mg total) by mouth daily., Disp: 30 tablet, Rfl: 1   Cholecalciferol (VITAMIN  D-3 PO), Take 5,000 Units by mouth daily. , Disp: , Rfl:    clonazePAM  (KLONOPIN ) 0.5 MG tablet, Take 1 tablet (0.5 mg total) by mouth daily as needed for anxiety., Disp: 30 tablet, Rfl: 0   Coenzyme Q10 (CO Q 10 PO), Take 100 mg by mouth daily. , Disp: , Rfl:    mometasone  (NASONEX ) 50 MCG/ACT nasal spray, Place 2 sprays into the nose 2 (two) times daily as needed for up to 5 days., Disp: 1 each, Rfl: 1   Blood Pressure Monitoring (OMRON 5 SERIES BP MONITOR) DEVI, , Disp: , Rfl:    Allergies  Allergen Reactions   Iodine Hives   Sulfa Antibiotics       Review of Systems  Constitutional:  Negative for fever.  HENT:  Positive for congestion, ear pain, sinus pressure and sinus pain.   Respiratory: Negative.    Cardiovascular: Negative.   Gastrointestinal: Negative.   Musculoskeletal: Negative.   Skin: Negative.   Psychiatric/Behavioral: Negative.       Today's Vitals   08/30/24 0917  BP: 120/60  Pulse: 75  Temp: 98.1 F (36.7 C)  TempSrc: Oral  Weight: 131 lb (59.4 kg)  Height: 5' 2 (1.575 m)  PainSc: 0-No pain   Body mass index is 23.96 kg/m.  Wt Readings from Last 3 Encounters:  08/30/24 131 lb (59.4 kg)  07/24/24 129 lb 9.6 oz (58.8 kg)  01/18/24 125 lb 9.6 oz (57 kg)    The 10-year ASCVD risk score (Arnett DK, et al., 2019) is: 11%   Values used to calculate the score:     Age: 11 years     Clincally relevant sex: Female     Is Non-Hispanic African American: No     Diabetic: No     Tobacco smoker: No     Systolic Blood Pressure: 120 mmHg     Is BP treated: No     HDL Cholesterol: 66 mg/dL     Total Cholesterol: 290 mg/dL  Objective:  Physical Exam Constitutional:      Appearance: Normal appearance.  HENT:     Head: Normocephalic.  Cardiovascular:     Rate and Rhythm: Normal rate and regular rhythm.     Pulses: Normal pulses.     Heart sounds: Normal heart sounds.  Pulmonary:     Effort: Pulmonary effort is normal.     Breath sounds: Normal breath sounds.  Abdominal:     General: Bowel sounds are normal.  Neurological:     Mental Status: She is alert.         Assessment And Plan:   Assessment & Plan Head congestion COVID 19 test neg Sinus pain COVID 19 test neg Allergic rhinitis with postnasal drip Symptoms consistent with allergic rhinitis, likely seasonal. Prefers oral medication over injections. CLL (chronic lymphocytic leukemia) (HCC) Chronic. Denies fevers or night sweats- Keep next appointment with oncologist in April.   Orders Placed This Encounter  Procedures   POC  Covid19/Flu A&B Antigen     Return if symptoms worsen or fail to improve, for keep next appt.  Patient was given opportunity to ask questions. Patient verbalized understanding of the plan and was able to repeat key elements of the plan. All questions were answered to their satisfaction.    I, Bruna Creighton, NP, have reviewed all documentation for this visit. The documentation on 11.25/2025 for the exam, diagnosis, procedures, and orders are all accurate and complete.    IF YOU HAVE BEEN REFERRED  TO A SPECIALIST, IT MAY TAKE 1-2 WEEKS TO SCHEDULE/PROCESS THE REFERRAL. IF YOU HAVE NOT HEARD FROM US /SPECIALIST IN TWO WEEKS, PLEASE GIVE US  A CALL AT [phone].

## 2024-09-10 NOTE — Assessment & Plan Note (Signed)
 Chronic. Denies fevers or night sweats- Keep next appointment with oncologist in April.

## 2024-11-27 ENCOUNTER — Ambulatory Visit

## 2025-01-22 ENCOUNTER — Encounter: Payer: Self-pay | Admitting: Internal Medicine
